# Patient Record
Sex: Female | Born: 1980
Health system: Southern US, Community
[De-identification: ages and names within clinical notes are randomized; demographics above are authoritative.]

## PROBLEM LIST (undated history)

## (undated) ENCOUNTER — Inpatient Hospital Stay (HOSPITAL_COMMUNITY): Payer: Self-pay

## (undated) DIAGNOSIS — M069 Rheumatoid arthritis, unspecified: Secondary | ICD-10-CM

## (undated) DIAGNOSIS — R519 Headache, unspecified: Secondary | ICD-10-CM

## (undated) DIAGNOSIS — F419 Anxiety disorder, unspecified: Secondary | ICD-10-CM

## (undated) DIAGNOSIS — H539 Unspecified visual disturbance: Secondary | ICD-10-CM

## (undated) DIAGNOSIS — Z87898 Personal history of other specified conditions: Secondary | ICD-10-CM

## (undated) DIAGNOSIS — R51 Headache: Secondary | ICD-10-CM

## (undated) HISTORY — DX: Unspecified visual disturbance: H53.9

## (undated) HISTORY — DX: Anxiety disorder, unspecified: F41.9

## (undated) HISTORY — DX: Personal history of other specified conditions: Z87.898

## (undated) HISTORY — PX: WISDOM TOOTH EXTRACTION: SHX21

## (undated) HISTORY — DX: Rheumatoid arthritis, unspecified: M06.9

---

## 2004-04-13 ENCOUNTER — Ambulatory Visit: Payer: Self-pay | Admitting: Internal Medicine

## 2004-04-27 ENCOUNTER — Ambulatory Visit: Payer: Self-pay | Admitting: Internal Medicine

## 2004-05-11 ENCOUNTER — Ambulatory Visit: Payer: Self-pay | Admitting: Internal Medicine

## 2004-10-09 ENCOUNTER — Emergency Department: Payer: Self-pay | Admitting: Emergency Medicine

## 2005-01-26 ENCOUNTER — Emergency Department: Payer: Self-pay | Admitting: Emergency Medicine

## 2005-01-26 ENCOUNTER — Ambulatory Visit: Payer: Self-pay

## 2005-01-30 ENCOUNTER — Ambulatory Visit: Payer: Self-pay | Admitting: Unknown Physician Specialty

## 2005-12-26 ENCOUNTER — Ambulatory Visit (HOSPITAL_COMMUNITY): Admission: RE | Admit: 2005-12-26 | Discharge: 2005-12-26 | Payer: Self-pay | Admitting: Obstetrics

## 2006-02-13 ENCOUNTER — Encounter: Admission: RE | Admit: 2006-02-13 | Discharge: 2006-03-21 | Payer: Self-pay | Admitting: Obstetrics

## 2006-04-07 ENCOUNTER — Inpatient Hospital Stay (HOSPITAL_COMMUNITY): Admission: AD | Admit: 2006-04-07 | Discharge: 2006-04-07 | Payer: Self-pay | Admitting: Obstetrics & Gynecology

## 2006-04-16 ENCOUNTER — Inpatient Hospital Stay (HOSPITAL_COMMUNITY): Admission: AD | Admit: 2006-04-16 | Discharge: 2006-04-21 | Payer: Self-pay | Admitting: Obstetrics

## 2006-04-19 ENCOUNTER — Encounter (INDEPENDENT_AMBULATORY_CARE_PROVIDER_SITE_OTHER): Payer: Self-pay | Admitting: Specialist

## 2006-12-24 ENCOUNTER — Ambulatory Visit: Payer: Self-pay | Admitting: Internal Medicine

## 2006-12-24 DIAGNOSIS — L299 Pruritus, unspecified: Secondary | ICD-10-CM | POA: Insufficient documentation

## 2006-12-24 DIAGNOSIS — F439 Reaction to severe stress, unspecified: Secondary | ICD-10-CM | POA: Insufficient documentation

## 2006-12-24 DIAGNOSIS — F411 Generalized anxiety disorder: Secondary | ICD-10-CM

## 2007-03-03 DIAGNOSIS — J019 Acute sinusitis, unspecified: Secondary | ICD-10-CM | POA: Insufficient documentation

## 2007-03-08 DIAGNOSIS — R059 Cough, unspecified: Secondary | ICD-10-CM | POA: Insufficient documentation

## 2007-03-08 DIAGNOSIS — R05 Cough: Secondary | ICD-10-CM | POA: Insufficient documentation

## 2007-03-13 ENCOUNTER — Ambulatory Visit: Payer: Self-pay | Admitting: Internal Medicine

## 2007-04-29 ENCOUNTER — Ambulatory Visit: Payer: Self-pay | Admitting: Internal Medicine

## 2007-04-29 DIAGNOSIS — L280 Lichen simplex chronicus: Secondary | ICD-10-CM | POA: Insufficient documentation

## 2007-08-13 ENCOUNTER — Ambulatory Visit (HOSPITAL_COMMUNITY): Admission: RE | Admit: 2007-08-13 | Discharge: 2007-08-13 | Payer: Self-pay | Admitting: Obstetrics

## 2007-12-11 ENCOUNTER — Inpatient Hospital Stay (HOSPITAL_COMMUNITY): Admission: AD | Admit: 2007-12-11 | Discharge: 2007-12-13 | Payer: Self-pay | Admitting: Obstetrics

## 2008-02-04 ENCOUNTER — Ambulatory Visit: Payer: Self-pay | Admitting: Internal Medicine

## 2008-02-04 DIAGNOSIS — R21 Rash and other nonspecific skin eruption: Secondary | ICD-10-CM | POA: Insufficient documentation

## 2008-02-05 LAB — CONVERTED CEMR LAB
ALT: 21 units/L (ref 0–35)
AST: 20 units/L (ref 0–37)
Albumin: 4 g/dL (ref 3.5–5.2)
Alkaline Phosphatase: 56 units/L (ref 39–117)
Anti Nuclear Antibody(ANA): NEGATIVE
BUN: 9 mg/dL (ref 6–23)
Basophils Absolute: 0 10*3/uL (ref 0.0–0.1)
Basophils Relative: 0.7 % (ref 0.0–3.0)
Bilirubin, Direct: 0.1 mg/dL (ref 0.0–0.3)
CO2: 27 meq/L (ref 19–32)
CRP, High Sensitivity: 2 (ref 0.00–5.00)
Calcium: 9 mg/dL (ref 8.4–10.5)
Chloride: 110 meq/L (ref 96–112)
Creatinine, Ser: 0.8 mg/dL (ref 0.4–1.2)
Creatinine,U: 189.2 mg/dL
Eosinophils Absolute: 0.2 10*3/uL (ref 0.0–0.7)
Eosinophils Relative: 4.7 % (ref 0.0–5.0)
GFR calc Af Amer: 112 mL/min
GFR calc non Af Amer: 93 mL/min
Glucose, Bld: 52 mg/dL — ABNORMAL LOW (ref 70–99)
HCT: 36.4 % (ref 36.0–46.0)
Hemoglobin: 12.9 g/dL (ref 12.0–15.0)
Lymphocytes Relative: 37.1 % (ref 12.0–46.0)
MCHC: 35.4 g/dL (ref 30.0–36.0)
MCV: 82.1 fL (ref 78.0–100.0)
Microalb Creat Ratio: 5.8 mg/g (ref 0.0–30.0)
Microalb, Ur: 1.1 mg/dL (ref 0.0–1.9)
Monocytes Absolute: 0.4 10*3/uL (ref 0.1–1.0)
Monocytes Relative: 9.3 % (ref 3.0–12.0)
Neutro Abs: 2 10*3/uL (ref 1.4–7.7)
Neutrophils Relative %: 48.2 % (ref 43.0–77.0)
Phosphorus: 4.1 mg/dL (ref 2.3–4.6)
Platelets: 293 10*3/uL (ref 150–400)
Potassium: 4.2 meq/L (ref 3.5–5.1)
RBC: 4.43 M/uL (ref 3.87–5.11)
RDW: 19.1 % — ABNORMAL HIGH (ref 11.5–14.6)
Rhuematoid fact SerPl-aCnc: <20 intl units/mL — ABNORMAL LOW (ref 0.0–20.0)
Sed Rate: 19 mm/hr (ref 0–22)
Sodium: 143 meq/L (ref 135–145)
TSH: 0.61 microintl units/mL (ref 0.35–5.50)
Total Bilirubin: 0.7 mg/dL (ref 0.3–1.2)
Total Protein: 7.3 g/dL (ref 6.0–8.3)
WBC: 4.1 10*3/uL — ABNORMAL LOW (ref 4.5–10.5)

## 2008-02-28 ENCOUNTER — Telehealth: Payer: Self-pay | Admitting: Internal Medicine

## 2008-04-08 ENCOUNTER — Ambulatory Visit: Payer: Self-pay | Admitting: Internal Medicine

## 2008-05-11 ENCOUNTER — Ambulatory Visit: Payer: Self-pay | Admitting: Internal Medicine

## 2008-07-21 ENCOUNTER — Ambulatory Visit: Payer: Self-pay | Admitting: Internal Medicine

## 2008-07-21 DIAGNOSIS — M2550 Pain in unspecified joint: Secondary | ICD-10-CM | POA: Insufficient documentation

## 2008-10-02 ENCOUNTER — Ambulatory Visit: Payer: Self-pay | Admitting: Internal Medicine

## 2008-10-02 LAB — CONVERTED CEMR LAB: Rapid Strep: NEGATIVE

## 2008-12-10 ENCOUNTER — Ambulatory Visit: Payer: Self-pay | Admitting: Internal Medicine

## 2008-12-10 DIAGNOSIS — L723 Sebaceous cyst: Secondary | ICD-10-CM | POA: Insufficient documentation

## 2009-01-20 ENCOUNTER — Ambulatory Visit: Payer: Self-pay | Admitting: Family Medicine

## 2009-03-01 ENCOUNTER — Ambulatory Visit: Payer: Self-pay | Admitting: Internal Medicine

## 2009-03-01 DIAGNOSIS — R634 Abnormal weight loss: Secondary | ICD-10-CM | POA: Insufficient documentation

## 2009-03-03 LAB — CONVERTED CEMR LAB
ALT: 30 units/L (ref 0–35)
AST: 22 units/L (ref 0–37)
Albumin: 4.2 g/dL (ref 3.5–5.2)
Alkaline Phosphatase: 84 units/L (ref 39–117)
BUN: 7 mg/dL (ref 6–23)
Basophils Absolute: 0.1 10*3/uL (ref 0.0–0.1)
Basophils Relative: 1.1 % (ref 0.0–3.0)
Bilirubin, Direct: 0 mg/dL (ref 0.0–0.3)
CO2: 28 meq/L (ref 19–32)
Calcium: 8.8 mg/dL (ref 8.4–10.5)
Chloride: 105 meq/L (ref 96–112)
Creatinine, Ser: 0.8 mg/dL (ref 0.4–1.2)
Eosinophils Absolute: 0.3 10*3/uL (ref 0.0–0.7)
Eosinophils Relative: 5 % (ref 0.0–5.0)
GFR calc non Af Amer: 91.51 mL/min (ref >60)
Glucose, Bld: 77 mg/dL (ref 70–99)
HCT: 37.2 % (ref 36.0–46.0)
Hemoglobin: 12.8 g/dL (ref 12.0–15.0)
Lymphocytes Relative: 27.3 % (ref 12.0–46.0)
Lymphs Abs: 1.7 10*3/uL (ref 0.7–4.0)
MCHC: 34.3 g/dL (ref 30.0–36.0)
MCV: 95.6 fL (ref 78.0–100.0)
Monocytes Absolute: 0.5 10*3/uL (ref 0.1–1.0)
Monocytes Relative: 7.9 % (ref 3.0–12.0)
Neutro Abs: 3.8 10*3/uL (ref 1.4–7.7)
Neutrophils Relative %: 58.7 % (ref 43.0–77.0)
Phosphorus: 4.3 mg/dL (ref 2.3–4.6)
Platelets: 174 10*3/uL (ref 150.0–400.0)
Potassium: 3.6 meq/L (ref 3.5–5.1)
RBC: 3.89 M/uL (ref 3.87–5.11)
RDW: 12.1 % (ref 11.5–14.6)
Sodium: 139 meq/L (ref 135–145)
TSH: 0.9 microintl units/mL (ref 0.35–5.50)
Total Bilirubin: 0.9 mg/dL (ref 0.3–1.2)
Total Protein: 6.7 g/dL (ref 6.0–8.3)
WBC: 6.4 10*3/uL (ref 4.5–10.5)

## 2009-11-04 ENCOUNTER — Ambulatory Visit: Payer: Self-pay | Admitting: Family Medicine

## 2009-11-04 DIAGNOSIS — M549 Dorsalgia, unspecified: Secondary | ICD-10-CM | POA: Insufficient documentation

## 2009-11-04 DIAGNOSIS — M26629 Arthralgia of temporomandibular joint, unspecified side: Secondary | ICD-10-CM | POA: Insufficient documentation

## 2009-11-04 DIAGNOSIS — M069 Rheumatoid arthritis, unspecified: Secondary | ICD-10-CM | POA: Insufficient documentation

## 2009-11-04 LAB — CONVERTED CEMR LAB
CRP, High Sensitivity: 0.56 (ref 0.00–5.00)
Sed Rate: 12 mm/hr (ref 0–22)

## 2009-11-05 LAB — CONVERTED CEMR LAB
Cyclic Citrullin Peptide Ab: <2 units (ref 0.0–5.0)
Rhuematoid fact SerPl-aCnc: <20 intl units/mL (ref 0–20)

## 2009-11-09 ENCOUNTER — Telehealth (INDEPENDENT_AMBULATORY_CARE_PROVIDER_SITE_OTHER): Payer: Self-pay | Admitting: *Deleted

## 2010-05-10 NOTE — Assessment & Plan Note (Signed)
Summary: JAW,BACK PAIN/CLE   Vital Signs:  Patient profile:   30 year old female Height:      65 inches Weight:      105.0 pounds BMI:     17.54 Temp:     98.7 degrees F oral Pulse rate:   76 / minute Pulse rhythm:   regular BP sitting:   100 / 70  (left arm) Cuff size:   regular  Vitals Entered By: Benny Lennert CMA Duncan Dull) (November 04, 2009 10:25 AM)  History of Present Illness: Chief complaint jaw and back pain  30 year old female:  Theumatoid arthritis. the patient reports being on methotrexate in the past, having seen Dr. Melbourne Abts are normal. I don't have old records from different consult most, but did review  some of her lab results,, and I told them do. The condition of the plausible disorder, within this I did have to defer to her polys in rheumatology.   Jaw pain some.   Was on MTX.  She also has 2 small children age 54 years and 3 years and is bending over picking them up a lot.  No bowel or bladder anesthesia, no weakness is noted she noted and no dad sensory spots as well.  REVIEW OF SYSTEMS  GEN: No systemic complaints, no fevers, chills, sweats, or other acute illnesses MSK: Detailed in the HPI GI: tolerating PO intake without difficulty Neuro: No numbness, parasthesias, or tingling associated. Otherwise the pertinent positives of the ROS are noted above.    Allergies: 1)  Citalopram Hydrobromide (Citalopram Hydrobromide)  Past History:  Past medical, surgical, family and social histories (including risk factors) reviewed, and no changes noted (except as noted below).  Past Medical History: Reviewed history from 12/24/2006 and no changes required. Anxiety  Family History: Reviewed history from 03/01/2009 and no changes required. Mom has lupus, hypothyroidism Brother has hyperthyroidism  Social History: Reviewed history from 12/10/2008 and no changes required. 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  Ears, externally no deformities Pulm: Breathing comfortably in no respiratory distress Range of motion at  the waist: on normal  No echymosis or edema Rises to examination table withNo difficulty Gait: minimally antalgic   Inspection/Deformity: N Paraspinus T: Y  B Ankle Dorsiflexion (L5,4): 5/5 B Great Toe Dorsiflexion (L5,4): 5/5 Heel Walk (L5): WNL Toe Walk (S1): WNL Rise/Squat (L4): WNL, mild pain, some discomfort  SENSORY B Medial Foot (L4): WNL B Dorsum (L5): WNL B Lateral (S1): WNL Light Touch: WNL Pinprick: WNL  REFLEXES Knee (L4): 2+ Ankle (S1): 2+  B SLR, seated: neg B SLR, supine: neg B FABER: neg B Reverse FABER: neg B Greater Troch: NT B Log Roll: neg B Stork: NT B Sciatic Notch: NT Leg Lengths: equal  John no focal skin tenderness or redness around it, it is tender but opening    Impression & Recommendations:  Problem # 1:  RHEUMATOID ARTHRITIS (ICD-714.0) Patient says she was on methotrexate, have to soon as she does have indeed a rheumatological disorder.  And then they'll check a sedimentation rate CRP to see if this will be most consistent with inflammatory arthritis, liver units presentation I think that'll be less likely.  ggoing to see Dr. Garald Balding will see her again, given the bases of DMARD medications,  patient is interested in  some therapy. She is not wanting a pregnancy at all right now and currently has an IUD the  Her updated medication list for this problem includes:    Prednisone 20 Mg Tabs (Prednisone) .Marland Kitchen... 2 tabs by mouth daily for a week, then 1 tab for  4 days  Problem # 2:  BACK PAIN (ICD-724.5) almost a scale of cold Her updated medication list for this problem  includes:    Tizanidine Hcl 4 Mg Tabs (Tizanidine hcl) .Marland Kitchen... 1 by mouth at bedtime  Orders: Rheumatology Referral (Rheumatology) Venipuncture (60454) TLB-CRP-High Sensitivity (C-Reactive Protein) (86140-FCRP) TLB-Sedimentation Rate (ESR) (85652-ESR) T- * Misc. Laboratory test [phone]) Specimen Handling (91478) T-Rheumatoid Factor [phone])  Problem # 3:  TMJ PAIN (ICD-524.62) the jaw pain some pseudoptosis in the mid good candidate pain  Complete Medication List: 1)  Lorazepam 0.5 Mg Tabs (Lorazepam) .... 1/2 - 1 tab two times a day as needed for nerves 2)  Prednisone 20 Mg Tabs (Prednisone) .... 2 tabs by mouth daily for a week, then 1 tab for  4 days 3)  Tizanidine Hcl 4 Mg Tabs (Tizanidine hcl) .Marland Kitchen.. 1 by mouth at bedtime  Patient Instructions: 1)  Referral Appointment Information 2)  Day/Date: 3)  Time: 4)  Place/MD: 5)  Address: 6)  Phone/Fax: 7)  Patient given appointment information. Information/Orders faxed/mailed.  Prescriptions: TIZANIDINE HCL 4 MG TABS (TIZANIDINE HCL) 1 by mouth at bedtime  #30 x 0   Entered and Authorized by:   Hannah Beat MD   Signed by:   Hannah Beat MD on 11/04/2009   Method used:   Electronically to        [facility]  [address] [id number]* (retail)       [address]       [place], [place]  [address]       Ph: [phone] or [phone]       Fax: [phone]   RxID:   [id number] PREDNISONE 20 MG TABS (PREDNISONE) 2 tabs by mouth daily for a week, then 1 tab for  4 days  #18 x 0   Entered and Authorized by:   Hannah Beat MD   Signed by:   Hannah Beat MD on 11/04/2009   Method used:   Electronically to        [facility]  [address] [id number]* (retail)       [address]       [place], [place]  [address]       Ph: [phone] or [phone]       Fax: [phone]   RxID:   [id number]   Current Allergies (reviewed today): CITALOPRAM HYDROBROMIDE (CITALOPRAM  HYDROBROMIDE)

## 2010-05-10 NOTE — Progress Notes (Signed)
Summary: To Keep Rheumatology appt per Dr. Patsy Lager  ---- Converted from flag ----  Sp w/ Pt, she will keep the Rheumatology appt.Daine Gip    ---- 11/09/2009 6:28 AM, Hannah Beat MD wrote: No -- I meant that the patient currently has a normal sed rate and CRP. Unlikely that current jaw / back pain coming from rheumatological disease.    She tells me that she has RA and was on methotrexate in the past. Discussion about newer disease modifying agents is most appropriate with her rheumatologist for long term. I do not have all notes, but am pretty convinced that Dr. Garald Balding. would not have put her on that medication without some type of Rheumatological disorder.  Please make this a part of the note.   ---- 11/08/2009 2:48 PM, Daine Gip wrote: Dr. Patsy Lager, Pt says she was told by the nurse she did not have to go to the Rheumatology referral, based on the recent labs,  Please advise. I have an appt scheduled for August 10th w/ Dr. Lavenia Atlas...   Aram Beecham ------------------------------

## 2010-09-07 ENCOUNTER — Encounter: Payer: Self-pay | Admitting: Internal Medicine

## 2010-09-08 ENCOUNTER — Ambulatory Visit (INDEPENDENT_AMBULATORY_CARE_PROVIDER_SITE_OTHER): Payer: Managed Care, Other (non HMO) | Admitting: Internal Medicine

## 2010-09-08 ENCOUNTER — Encounter: Payer: Self-pay | Admitting: Internal Medicine

## 2010-09-08 VITALS — BP 102/60 | HR 90 | Temp 98.8°F | Ht 65.0 in | Wt 102.0 lb

## 2010-09-08 DIAGNOSIS — F411 Generalized anxiety disorder: Secondary | ICD-10-CM

## 2010-09-08 DIAGNOSIS — M069 Rheumatoid arthritis, unspecified: Secondary | ICD-10-CM

## 2010-09-08 DIAGNOSIS — M549 Dorsalgia, unspecified: Secondary | ICD-10-CM

## 2010-09-08 DIAGNOSIS — M26629 Arthralgia of temporomandibular joint, unspecified side: Secondary | ICD-10-CM

## 2010-09-08 MED ORDER — CYCLOBENZAPRINE HCL 10 MG PO TABS: 5.0000 mg | ORAL_TABLET | Freq: Every evening | ORAL | Status: DC | PRN

## 2010-09-08 MED ORDER — VENLAFAXINE HCL ER 75 MG PO CP24: 75.0000 mg | ORAL_CAPSULE | Freq: Every day | ORAL | Status: DC

## 2010-09-08 NOTE — Patient Instructions (Signed)
Please try a mouth guard at night to see if that helps your jaw Call if you have persistent problems with the new medication

## 2010-09-08 NOTE — Assessment & Plan Note (Signed)
Exacerbated again No sig depression Failed 3 SSRIs due to side effects Will try venlafaxine---consider buspirone but she is not sure about compliance, esp with bid

## 2010-09-08 NOTE — Assessment & Plan Note (Signed)
This is clearly in question No evidence of active disease currently

## 2010-09-08 NOTE — Assessment & Plan Note (Signed)
Ongoing issues Still seems to be just TMJ Discussed trying a mouth guard at night

## 2010-09-08 NOTE — Assessment & Plan Note (Signed)
Not clearly nerve related despite the burning Will try muscle relaxer for bedtime

## 2010-09-08 NOTE — Progress Notes (Signed)
  Subjective:    Patient ID: Debra Gibbs, female    DOB: September 19, 1980, 30 y.o.   MRN: [id number]  HPI Micah Flesher to rheumatologist some years ago Had knee drained and cortisone shot in leg Told she has RA but hasn't been on meds  Still having jaw pain on the right Has kept up with dental appts--no reported teeth problems Wisdom teeth removed also Points to top of right mandible No known teeth grinding  Also has burning pain along right posterior shoulder Trying to be better with her posture Awakens her several times per night for the past few months  Anxiety has been an ongoing issue Lots going on---rarely uses lorazepam (no refill in 18 months) Now back to using more regularly Lots of deaths and other "stuff" Concern that her daughter may have Asperger's Now back to being scared of "everything" Works herself up worried that more people will die (GF and cousin lately) Mind still goes "a mile a minute" despite the lorazepam Has more bad days than good lately Doesn't feel depressed Not anhedonic---loves family and kids  Past use of prozac (bad mood swings) and paxil (increased panic symptoms) and celexa (made her jittery)  Current outpatient prescriptions:LORazepam (ATIVAN) 0.5 MG tablet, Take 1/2-1 tablet by mouth two times a day as needed for nerves , Disp: , Rfl: ;  DISCONTD: tiZANidine (ZANAFLEX) 4 MG capsule, Take 4 mg by mouth at bedtime.  , Disp: , Rfl:   Past Medical History  Diagnosis Date  . Anxiety     No past surgical history on file.  No family history on file.  History   Social History  . Marital Status: Married    Spouse Name: N/A    Number of Children: 2  . Years of Education: N/A   Occupational History  . waitressing part-time, [employer] in [place]    Social History Main Topics  . Smoking status: Former Smoker    Quit date: 04/10/2005  . Smokeless tobacco: Never Used  . Alcohol Use: No  . Drug Use: Not on file  . Sexually Active: Not on file    Other Topics Concern  . Not on file   Social History Narrative  . No narrative on file   Review of Systems Appetite is never great Weight is stable    Objective:   Physical Exam  Constitutional: She appears well-developed and well-nourished. No distress.  HENT:       Localized tenderness right at right TMJ No clicking No oral lesions  Neck: Normal range of motion. Neck supple.  Musculoskeletal:       No synovitis at all No restriction in ROM at right shoulder or sig spasm  Lymphadenopathy:    She has no cervical adenopathy.  Psychiatric: Her behavior is normal. Judgment and thought content normal.       Mildly anxious  Some tears when relating all stressors          Assessment & Plan:

## 2010-09-13 ENCOUNTER — Telehealth: Payer: Self-pay | Admitting: *Deleted

## 2010-09-13 NOTE — Telephone Encounter (Signed)
Patient says that the flexeril is not helping at all. She is asking if there is something else she could try. Uses [facility] [address]

## 2010-09-14 MED ORDER — CARISOPRODOL 350 MG PO TABS: ORAL_TABLET | ORAL | Status: DC

## 2010-09-14 NOTE — Telephone Encounter (Signed)
rx sent to pharmacy, Spoke with patient and advised results

## 2010-09-14 NOTE — Telephone Encounter (Signed)
Okay to send Rx for carisprodol 350mg  #30 x 0 1/2-1 at bedtime prn for pain or muscle spasm

## 2010-09-29 ENCOUNTER — Encounter: Payer: Self-pay | Admitting: Internal Medicine

## 2010-09-29 ENCOUNTER — Ambulatory Visit (INDEPENDENT_AMBULATORY_CARE_PROVIDER_SITE_OTHER): Payer: Managed Care, Other (non HMO) | Admitting: Internal Medicine

## 2010-09-29 VITALS — BP 100/60 | HR 79 | Temp 98.4°F | Ht 65.0 in | Wt 102.0 lb

## 2010-09-29 DIAGNOSIS — F411 Generalized anxiety disorder: Secondary | ICD-10-CM

## 2010-09-29 MED ORDER — LORAZEPAM 0.5 MG PO TABS: 0.2500 mg | ORAL_TABLET | Freq: Three times a day (TID) | ORAL | Status: DC | PRN

## 2010-09-29 NOTE — Progress Notes (Signed)
  Subjective:    Patient ID: Debra Gibbs, female    DOB: 1981/03/02, 30 y.o.   MRN: [id number]  HPI Tried the venlafaxine for a few days Noted that she was a little shaky Stopped it due to this  Now feels like her anxiety is a little better Feels like she can do without the everyday med  Since last visit Has had a couple of spells of anxiety Has used the lorazepam with some success  Not depressed  Current Outpatient Prescriptions on File Prior to Visit  Medication Sig Dispense Refill  . carisoprodol (SOMA) 350 MG tablet Take 1/2-1 tablet by mouth once daily at bedtime as needed  30 tablet  0  . venlafaxine (EFFEXOR-XR) 75 MG 24 hr capsule Take 1 capsule (75 mg total) by mouth daily.  30 capsule  11  . DISCONTD: LORazepam (ATIVAN) 0.5 MG tablet Take 1/2-1 tablet by mouth two times a day as needed for nerves        Past Medical History  Diagnosis Date  . Anxiety     No past surgical history on file.  No family history on file.  History   Social History  . Marital Status: Married    Spouse Name: N/A    Number of Children: 2  . Years of Education: N/A   Occupational History  . waitressing part-time, [employer] in [place]    Social History Main Topics  . Smoking status: Former Smoker    Quit date: 04/10/2005  . Smokeless tobacco: Never Used  . Alcohol Use: No  . Drug Use: Not on file  . Sexually Active: Not on file   Other Topics Concern  . Not on file   Social History Narrative  . No narrative on file   Review of Systems Sleeps okay  Has used the flexeril for her neck/shoulder pain. Did have some hung over effect Now trying the carisprodol--not having side effects     Objective:   Physical Exam  Constitutional: She appears well-developed and well-nourished. No distress.  Psychiatric: She has a normal mood and affect. Her behavior is normal. Judgment and thought content normal.          Assessment & Plan:

## 2010-09-29 NOTE — Assessment & Plan Note (Signed)
Mild shakiness with venlafaxine but probably could tolerate Anxiety is better Will go back to just prn lorazepam

## 2010-10-05 ENCOUNTER — Encounter: Payer: Self-pay | Admitting: Family Medicine

## 2010-10-05 ENCOUNTER — Ambulatory Visit (INDEPENDENT_AMBULATORY_CARE_PROVIDER_SITE_OTHER): Payer: Managed Care, Other (non HMO) | Admitting: Family Medicine

## 2010-10-05 DIAGNOSIS — M7550 Bursitis of unspecified shoulder: Secondary | ICD-10-CM

## 2010-10-05 DIAGNOSIS — M67919 Unspecified disorder of synovium and tendon, unspecified shoulder: Secondary | ICD-10-CM

## 2010-10-05 DIAGNOSIS — M67929 Unspecified disorder of synovium and tendon, unspecified upper arm: Secondary | ICD-10-CM

## 2010-10-05 DIAGNOSIS — M7520 Bicipital tendinitis, unspecified shoulder: Secondary | ICD-10-CM

## 2010-10-05 MED ORDER — HYDROCODONE-ACETAMINOPHEN 5-500 MG PO TABS: 1.0000 | ORAL_TABLET | Freq: Four times a day (QID) | ORAL | Status: AC | PRN

## 2010-10-05 NOTE — Patient Instructions (Signed)
F/u 1 month 

## 2010-10-08 DIAGNOSIS — M7550 Bursitis of unspecified shoulder: Secondary | ICD-10-CM | POA: Insufficient documentation

## 2010-10-08 DIAGNOSIS — M67929 Unspecified disorder of synovium and tendon, unspecified upper arm: Secondary | ICD-10-CM | POA: Insufficient documentation

## 2010-10-08 NOTE — Progress Notes (Signed)
The patient noted above presents with shoulder pain that has been ongoing for several days there is no history of trauma or accident recently The patient does have some neck and trap strain and tension Denies dislocation, subluxation, separation of the shoulder. The patient does complain of pain in the overhead plane with significant painful arc of motion, mild with more pain with IROM  Medications Tried: Tylenol, NSAIDS, muscle relaxant, tramadol Ice or Heat: minimally helpful Tried PT: No  Prior shoulder Injury: No Prior surgery: No Prior fracture: No  The PMH, PSH, Social History, Family History, Medications, and allergies have been reviewed in [facility], and have been updated if relevant.  REVIEW OF SYSTEMS  GEN: No fevers, chills. Nontoxic. Primarily MSK c/o today. MSK: Detailed in the HPI GI: tolerating PO intake without difficulty Neuro: No numbness, parasthesias, or tingling associated. Otherwise the pertinent positives of the ROS are noted above.   PHYSICAL EXAM  Blood pressure 100/60, pulse 87, temperature 98.4 F (36.9 C), temperature source Oral, height 5\' 2"  (1.575 m), weight 103 lb (46.72 kg), SpO2 100.00%.  GEN: Well-developed,well-nourished,in no acute distress; alert,appropriate and cooperative throughout examination HEENT: Normocephalic and atraumatic without obvious abnormalities. Ears, externally no deformities PULM: Breathing comfortably in no respiratory distress EXT: No clubbing, cyanosis, or edema PSYCH: Normally interactive. Cooperative during the interview. Pleasant. Friendly and conversant. Not anxious or depressed appearing. Normal, full affect.  Shoulder: R Inspection: No muscle wasting, mild winging Ecchymosis/edema: neg  AC joint, scapula, clavicle: NT Cervical spine: NT, full ROM Spurling's: neg Abduction: full, 5/5 Flexion: full, 5/5 IR, full, lift-off: 5/5 ER at neutral: full, 5/5 AC crossover: neg Neer: minimally positive Hawkins: minimally  positive Drop Test: neg Empty Can: neg Supraspinatus insertion: mild-mod T Bicipital groove: TTP Speed's: TTP Yergason's: TTP Sulcus sign: neg Scapular dyskinesis: none C5-T1 intact  Neuro: Sensation intact Grip 5/5   Assessment and Plan: 1.  Biceps tendinopathy, RIGHT: tender at the tendon sheath and speeds and Yergason's are positive. Anterior shoulder pain. Continue his anti-inflammatories, she is having with subside severe pain so I gave her a low number of some Vicodin, which I think is probably reasonable over the next week or so, but I do not expect her to need any additional narcotics. Some tramadol is reasonable. Recommended rotator cuff strengthening and scapular stabilization, as well as some eccentric biceps lowers. Reviewed Harvard protocol.  2. Subcoracoid bursitis. Clinically on exam tender more in the subcoracoid region and with maneuvers to make this impinge.  F/u 1 month

## 2010-11-09 ENCOUNTER — Ambulatory Visit: Payer: Managed Care, Other (non HMO) | Admitting: Family Medicine

## 2010-11-09 DIAGNOSIS — Z0289 Encounter for other administrative examinations: Secondary | ICD-10-CM

## 2010-12-19 ENCOUNTER — Other Ambulatory Visit: Payer: Self-pay | Admitting: Internal Medicine

## 2010-12-19 NOTE — Telephone Encounter (Signed)
Okay #30 x 0 

## 2010-12-19 NOTE — Telephone Encounter (Signed)
rx called into pharmacy

## 2011-01-11 LAB — CBC
HCT: 27.3 — ABNORMAL LOW
HCT: 29.7 — ABNORMAL LOW
Hemoglobin: 9.3 — ABNORMAL LOW
Hemoglobin: 9.7 — ABNORMAL LOW
MCHC: 32.7
MCHC: 34
MCV: 84.4
MCV: 88.6
Platelets: 195
Platelets: 223
RBC: 3.08 — ABNORMAL LOW
RBC: 3.52 — ABNORMAL LOW
RDW: 14.6
RDW: 15
WBC: 10.3
WBC: 8.3

## 2011-01-11 LAB — RPR: RPR Ser Ql: NONREACTIVE

## 2011-02-13 ENCOUNTER — Encounter: Payer: Self-pay | Admitting: Family Medicine

## 2011-02-13 ENCOUNTER — Ambulatory Visit (INDEPENDENT_AMBULATORY_CARE_PROVIDER_SITE_OTHER): Payer: Managed Care, Other (non HMO) | Admitting: Family Medicine

## 2011-02-13 VITALS — BP 102/70 | HR 90 | Temp 98.2°F | Ht 62.0 in | Wt 103.0 lb

## 2011-02-13 DIAGNOSIS — J069 Acute upper respiratory infection, unspecified: Secondary | ICD-10-CM

## 2011-02-13 MED ORDER — AMOXICILLIN-POT CLAVULANATE 875-125 MG PO TABS: 1.0000 | ORAL_TABLET | Freq: Two times a day (BID) | ORAL | Status: AC

## 2011-02-13 MED ORDER — CHLORPHENIRAMINE-HYDROCODONE 8-10 MG/5ML PO LQCR: 5.0000 mL | Freq: Two times a day (BID) | ORAL | Status: DC | PRN

## 2011-02-13 NOTE — Patient Instructions (Signed)
Take antibiotic as directed if symptoms do not improve.  Drink lots of fluids.  Treat sympotmatically with Mucinex, nasal saline irrigation, and Tylenol/Ibuprofen. Also try claritin D or zyrtec D over the counter- two times a day as needed ( have to sign for them at pharmacy). You can use warm compresses.  Cough suppressant at night. Call if not improving as expected in 5-7 days.

## 2011-02-13 NOTE — Progress Notes (Signed)
SUBJECTIVE:  Debra Gibbs is a 30 y.o. female who complains of coryza, congestion and sneezing for 3days. She denies a history of anorexia, chest pain, dizziness, nausea and shortness of breath and denies a history of asthma. Patient denies smoke cigarettes.   Patient Active Problem List  Diagnoses  . ANXIETY  . TMJ PAIN  . NEURODERMATITIS  . RHEUMATOID ARTHRITIS  . BACK PAIN  . MALAR RASH  . WEIGHT LOSS, ABNORMAL  . Back pain  . Shoulder bursitis  . Biceps tendinopathy   Past Medical History  Diagnosis Date  . Anxiety    No past surgical history on file. History  Substance Use Topics  . Smoking status: Former Smoker    Quit date: 04/10/2005  . Smokeless tobacco: Never Used  . Alcohol Use: No   No family history on file. Allergies  Allergen Reactions  . Citalopram Hydrobromide     REACTION: shakes and made her jaw tight   Current Outpatient Prescriptions on File Prior to Visit  Medication Sig Dispense Refill  . LORazepam (ATIVAN) 0.5 MG tablet take 1/2 to 1 tablet by mouth every 8 hours if needed  30 tablet  0    OBJECTIVE: BP 102/70  Pulse 90  Temp(Src) 98.2 F (36.8 C) (Oral)  Ht 5\' 2"  (1.575 m)  Wt 103 lb (46.72 kg)  BMI 18.84 kg/m2  She appears well, vital signs are as noted. Ears normal.  Throat and pharynx normal.  Neck supple. No adenopathy in the neck. Nose is congested. Sinuses non tender. The chest is clear, without wheezes or rales.  ASSESSMENT:  viral upper respiratory illness  PLAN: Symptomatic therapy suggested: push fluids, rest and return office visit prn if symptoms persist or worsen. Lack of antibiotic effectiveness discussed with her. Call or return to clinic prn if these symptoms worsen or fail to improve as anticipated.

## 2011-02-27 ENCOUNTER — Telehealth: Payer: Self-pay | Admitting: *Deleted

## 2011-02-27 NOTE — Telephone Encounter (Signed)
Received fax refill request, in your IN box.

## 2011-02-27 NOTE — Telephone Encounter (Signed)
Okay to refill the cough syrup 60cc x 0 Make sure she is feeling better in general

## 2011-02-28 ENCOUNTER — Other Ambulatory Visit: Payer: Self-pay | Admitting: *Deleted

## 2011-02-28 MED ORDER — LORAZEPAM 0.5 MG PO TABS: 0.5000 mg | ORAL_TABLET | Freq: Three times a day (TID) | ORAL | Status: DC | PRN

## 2011-02-28 MED ORDER — HYDROCOD POLST-CHLORPHEN POLST 10-8 MG/5ML PO LQCR: 5.0000 mL | Freq: Two times a day (BID) | ORAL | Status: DC | PRN

## 2011-02-28 NOTE — Telephone Encounter (Signed)
Patient states she have death in the family and would like a refill of lorazepam, please advise

## 2011-02-28 NOTE — Telephone Encounter (Signed)
rx called into pharmacy Left message on machine that rx was called in

## 2011-02-28 NOTE — Telephone Encounter (Signed)
Okay to refill #30 x 0 

## 2011-02-28 NOTE — Telephone Encounter (Signed)
rx called into pharmacy

## 2011-03-01 ENCOUNTER — Telehealth: Payer: Self-pay | Admitting: Internal Medicine

## 2011-03-01 NOTE — Telephone Encounter (Signed)
Medication called in to [facility], spoke with [facility] and canceled that rx

## 2011-03-01 NOTE — Telephone Encounter (Signed)
Third request from pharmacy for Tussinex 60ml for patient.  Refill needs to be called in and not faxed back.  Call pharm at [phone]

## 2011-05-11 ENCOUNTER — Ambulatory Visit (INDEPENDENT_AMBULATORY_CARE_PROVIDER_SITE_OTHER)
Admission: RE | Admit: 2011-05-11 | Discharge: 2011-05-11 | Disposition: A | Discharge status: Home / Self Care | Payer: Managed Care, Other (non HMO) | Source: Ambulatory Visit | Attending: Internal Medicine | Admitting: Internal Medicine

## 2011-05-11 ENCOUNTER — Encounter: Payer: Self-pay | Admitting: Internal Medicine

## 2011-05-11 ENCOUNTER — Ambulatory Visit (INDEPENDENT_AMBULATORY_CARE_PROVIDER_SITE_OTHER): Payer: Managed Care, Other (non HMO) | Admitting: Internal Medicine

## 2011-05-11 VITALS — BP 111/73 | HR 98 | Temp 98.8°F | Wt 106.0 lb

## 2011-05-11 DIAGNOSIS — M549 Dorsalgia, unspecified: Secondary | ICD-10-CM

## 2011-05-11 NOTE — Progress Notes (Signed)
  Subjective:    Patient ID: Debra Gibbs, female    DOB: May 09, 1980, 31 y.o.   MRN: [id number]  HPI Having right upper lumbar pain Gets sharp pain at side of spine that shoots up if she moves in a certain direction--esp if she arches her back Chronic backaches---esp burning in upper back This is different--really feels it is in her spine  The shooting pain if very brief but then has isolated bony type pain in that one spot Then this pain may stay all day Has also occurred with twisting  Pain doesn't radiate to legs at all No leg weakness  First noticed about 2.5 weeks ago No known injury  Has been using ibuprofen and tramadol  Current Outpatient Prescriptions on File Prior to Visit  Medication Sig Dispense Refill  . LORazepam (ATIVAN) 0.5 MG tablet Take 1-2 tablets (0.5-1 mg total) by mouth every 8 (eight) hours as needed for anxiety.  30 tablet  0    Allergies  Allergen Reactions  . Citalopram Hydrobromide     REACTION: shakes and made her jaw tight    Past Medical History  Diagnosis Date  . Anxiety     No past surgical history on file.  No family history on file.  History   Social History  . Marital Status: Married    Spouse Name: N/A    Number of Children: 2  . Years of Education: N/A   Occupational History  . waitressing part-time, [employer] in [place]    Social History Main Topics  . Smoking status: Former Smoker    Quit date: 04/10/2005  . Smokeless tobacco: Never Used  . Alcohol Use: No  . Drug Use: Not on file  . Sexually Active: Not on file   Other Topics Concern  . Not on file   Social History Narrative  . No narrative on file    Review of Systems No rash  No fever Some swelling in toes---relates to her arthritis (sporadic) Intermittent jaw pain--mouth guard at night didn't help Appetite okay Weight stable    Objective:   Physical Exam  Constitutional: She appears well-developed and well-nourished. No distress.    Musculoskeletal:       Localized bony tenderness over T12 or L1 No paraspinal spasm or tenderness Back flexion limited by pain in spine area  Neurological:       Normal gait and strength          Assessment & Plan:

## 2011-05-11 NOTE — Assessment & Plan Note (Signed)
Has specific bony tenderness No muscle findings I don't see anything on x-ray--will await the radiologist report  Not sure more meds appropriate ?chiropractor ?orthopedist

## 2011-05-16 ENCOUNTER — Telehealth: Payer: Self-pay | Admitting: Internal Medicine

## 2011-05-16 NOTE — Telephone Encounter (Signed)
Called yesterday about x-ray results and received message that it came back fine but is still having pain in back and seeking advice; impacting her sleep, doctor said might be able to try other things.  Has tried heating pad and ibuprofen with no relief.  Please call patient back.

## 2011-05-17 ENCOUNTER — Telehealth: Payer: Self-pay | Admitting: Internal Medicine

## 2011-05-17 NOTE — Telephone Encounter (Signed)
Triage Record Num: [id number] Operator: Di Kindle Patient Name: Debra Gibbs Call Date & Time: 05/17/2011 2:54:57PM Patient Phone: [phone] PCP: Tillman Abide Patient Gender: Female PCP Fax : [phone] Patient DOB: 1981-01-28 Practice Name: [facility] Reason for Call: OFFICE Please: Caller: Debra Gibbs/Patient; PCP: Tillman Abide I.; CB#: [phone]; Call regarding Back Pain; onset ~ 3 weeks, last OV 05/11/11, was told xrays were normal, this pain continues with movement, sharp pain close to middle of back, using Tramadol, which relieves the pain only a small amt, sleeps less. Please call pt with recommendations. OFFICE Please Protocol(s) Used: PCP Calls, No Triage (Adult) Recommended Outcome per Protocol: Call Provider within 24 Hours Reason for Outcome: [1] Caller requests to speak ONLY to PCP AND [2] nonurgent question Care Advice: ~ 05/17/2011 3:03:25PM Page 1 of 1 [email or web address]

## 2011-05-17 NOTE — Telephone Encounter (Signed)
See other note with recommendations

## 2011-05-17 NOTE — Telephone Encounter (Signed)
With nerve type pains like this, occ prednisone is tried to reduce inflammation.  Please try prednisone 20mg   2 tabs daily for 5 days then 1 tab daily for 5 days #15 x 0 If ineffective, will try a medicine that works as a nerve pain reliever for nighttime (would try this if no better by Monday)

## 2011-05-18 ENCOUNTER — Telehealth: Payer: Self-pay | Admitting: Internal Medicine

## 2011-05-18 MED ORDER — HYDROCODONE-ACETAMINOPHEN 5-325 MG PO TABS: 1.0000 | ORAL_TABLET | Freq: Every evening | ORAL | Status: DC | PRN

## 2011-05-18 MED ORDER — PREDNISONE 20 MG PO TABS: ORAL_TABLET | ORAL | Status: DC

## 2011-05-18 NOTE — Telephone Encounter (Signed)
Okay to phone in norco 5/325 1-2 at bedtime prn for severe pain  #40 x 0

## 2011-05-18 NOTE — Telephone Encounter (Signed)
Patient is requesting pain medication until Prednisone is helping.  She said that she is uncomfortable at night and is using ibuprofen as well as heating pad with no relief.  She is requesting a mild pain pill for 1-2 days until Prednisone starts to help.  Call back is [phone].

## 2011-05-18 NOTE — Telephone Encounter (Signed)
rx called into pharmacy Spoke with patient and advised results   

## 2011-05-18 NOTE — Telephone Encounter (Signed)
Spoke with patient and advised results rx sent to pharmacy by e-script  

## 2011-06-20 ENCOUNTER — Other Ambulatory Visit: Payer: Self-pay | Admitting: *Deleted

## 2011-06-20 MED ORDER — LORAZEPAM 0.5 MG PO TABS: 0.5000 mg | ORAL_TABLET | Freq: Three times a day (TID) | ORAL | Status: DC | PRN

## 2011-06-20 NOTE — Telephone Encounter (Signed)
Px written for call in   

## 2011-06-20 NOTE — Telephone Encounter (Signed)
Phoned refill request from patient.  She states she hasnt had this filled since November.

## 2011-06-21 NOTE — Telephone Encounter (Signed)
Left vm for pt to callback 

## 2011-06-22 NOTE — Telephone Encounter (Signed)
Left v/m message on pts cell phone med had been called in to [facility] aid any questions pt could call back to our office.Medication phoned to [facility] aid [phone]. pharmacy as instructed.

## 2011-06-30 ENCOUNTER — Ambulatory Visit (INDEPENDENT_AMBULATORY_CARE_PROVIDER_SITE_OTHER): Payer: Managed Care, Other (non HMO) | Admitting: Internal Medicine

## 2011-06-30 ENCOUNTER — Encounter: Payer: Self-pay | Admitting: Internal Medicine

## 2011-06-30 VITALS — BP 100/60 | HR 81 | Temp 98.0°F | Wt 103.0 lb

## 2011-06-30 DIAGNOSIS — J019 Acute sinusitis, unspecified: Secondary | ICD-10-CM | POA: Insufficient documentation

## 2011-06-30 MED ORDER — AMOXICILLIN 500 MG PO TABS: 1000.0000 mg | ORAL_TABLET | Freq: Two times a day (BID) | ORAL | Status: AC

## 2011-06-30 NOTE — Progress Notes (Signed)
  Subjective:    Patient ID: Debra Gibbs, female    DOB: 03-Jan-1981, 31 y.o.   MRN: [id number]  HPI Back was really improved by the prednisone Also decreased swelling in toes  Feels her sinuses are infected Started 8-9 days ago Lots of cough  Sore throat and chest congestion Then went up into her head---now has pressure behind her eye---left side Can't breathe or smell out of left side Chills, lethargy, some SOB (due to nasal congestion) esp in past 1-2 days Sweat last night in bed Achy today  Has been using zyrtec for 2 weeks---helped at first but not this week Tried benedryl last night--dried her out some Did try some advil for headache earlier in the week  Current Outpatient Prescriptions on File Prior to Visit  Medication Sig Dispense Refill  . LORazepam (ATIVAN) 0.5 MG tablet Take 1-2 tablets (0.5-1 mg total) by mouth every 8 (eight) hours as needed for anxiety.  30 tablet  0    Allergies  Allergen Reactions  . Citalopram Hydrobromide     REACTION: shakes and made her jaw tight    Past Medical History  Diagnosis Date  . Anxiety     No past surgical history on file.  No family history on file.  History   Social History  . Marital Status: Married    Spouse Name: N/A    Number of Children: 2  . Years of Education: N/A   Occupational History  . waitressing part-time, [employer] in [place]    Social History Main Topics  . Smoking status: Former Smoker    Quit date: 04/10/2005  . Smokeless tobacco: Never Used  . Alcohol Use: No  . Drug Use: Not on file  . Sexually Active: Not on file   Other Topics Concern  . Not on file   Social History Narrative  . No narrative on file   Review of Systems No rash No vomiting or diarrhea No problems with appetite     Objective:   Physical Exam  Constitutional: She appears well-developed and well-nourished. No distress.  HENT:  Mouth/Throat: Oropharynx is clear and moist. No oropharyngeal exudate.       No  sinus tenderness TMs normal Moderate nasal inflammation  Neck: Normal range of motion. Neck supple.  Pulmonary/Chest: Effort normal and breath sounds normal. No respiratory distress. She has no wheezes. She has no rales.  Lymphadenopathy:    She has no cervical adenopathy.          Assessment & Plan:

## 2011-06-30 NOTE — Assessment & Plan Note (Signed)
Has concommitant allergies Seems like she has worsening infection that started a week ago Discussed supportive Rx Will give amoxil

## 2011-09-08 ENCOUNTER — Encounter (HOSPITAL_COMMUNITY): Payer: Self-pay | Admitting: *Deleted

## 2011-09-08 ENCOUNTER — Emergency Department (HOSPITAL_COMMUNITY)
Admission: EM | Admit: 2011-09-08 | Discharge: 2011-09-08 | Disposition: A | Discharge status: Home / Self Care | Payer: Managed Care, Other (non HMO) | Source: Home / Self Care | Attending: Emergency Medicine | Admitting: Emergency Medicine

## 2011-09-08 DIAGNOSIS — S61011A Laceration without foreign body of right thumb without damage to nail, initial encounter: Secondary | ICD-10-CM

## 2011-09-08 DIAGNOSIS — S61209A Unspecified open wound of unspecified finger without damage to nail, initial encounter: Secondary | ICD-10-CM

## 2011-09-08 MED ORDER — TETANUS-DIPHTH-ACELL PERTUSSIS 5-2.5-18.5 LF-MCG/0.5 IM SUSP
0.5000 mL | Freq: Once | INTRAMUSCULAR | Status: AC
  Administered 2011-09-08: 0.5 mL via INTRAMUSCULAR

## 2011-09-08 MED ORDER — IBUPROFEN 600 MG PO TABS: 600.0000 mg | ORAL_TABLET | Freq: Four times a day (QID) | ORAL | Status: AC | PRN

## 2011-09-08 MED ORDER — HYDROCODONE-ACETAMINOPHEN 5-325 MG PO TABS: 2.0000 | ORAL_TABLET | ORAL | Status: AC | PRN

## 2011-09-08 MED ORDER — TETANUS-DIPHTH-ACELL PERTUSSIS 5-2.5-18.5 LF-MCG/0.5 IM SUSP
INTRAMUSCULAR | Status: AC
  Filled 2011-09-08: qty 0.5

## 2011-09-08 NOTE — ED Notes (Signed)
Pt  Reports          She      Sustained  A  lacertion to  Her  r   Thumb  Today   She  Was        Washing  Dishes        And  Knife     Cut  Her  Thumb   Bleeding  Has  Subsided         Unsure  Of  Her  Last  Tetanus  Shot

## 2011-09-08 NOTE — ED Provider Notes (Signed)
History     CSN: [id number]  Arrival date & time 09/08/11  1406   First MD Initiated Contact with Patient 09/08/11 1438      Chief Complaint  Patient presents with  . Laceration    (Consider location/radiation/quality/duration/timing/severity/associated sxs/prior treatment) HPI Comments: Patient's left-hand female who sustained a laceration to the dorsal aspect of her right thumb earlier today. States that the knife was brand-new and clean. No numbness, tingling, weakness, redness, fevers. She works as a Child psychotherapist.   ROS as noted in HPI. All other ROS negative.   Patient is a 31 y.o. female presenting with skin laceration. The history is provided by the patient. No language interpreter was used.  Laceration  The incident occurred 3 to 5 hours ago. The laceration is located on the right hand. The laceration is 1 cm in size. The laceration mechanism was a a clean knife. The pain has been constant since onset. She reports no foreign bodies present. Her tetanus status is unknown.    Past Medical History  Diagnosis Date  . Anxiety     History reviewed. No pertinent past surgical history.  History reviewed. No pertinent family history.  History  Substance Use Topics  . Smoking status: Former Smoker    Quit date: 04/10/2005  . Smokeless tobacco: Never Used  . Alcohol Use: No    OB History    Grav Para Term Preterm Abortions TAB SAB Ect Mult Living                  Review of Systems  Allergies  Citalopram hydrobromide  Home Medications   Current Outpatient Rx  Name Route Sig Dispense Refill  . HYDROCODONE-ACETAMINOPHEN 5-325 MG PO TABS Oral Take 2 tablets by mouth every 4 (four) hours as needed for pain. 20 tablet 0  . IBUPROFEN 600 MG PO TABS Oral Take 1 tablet (600 mg total) by mouth every 6 (six) hours as needed for pain. 30 tablet 0  . LORAZEPAM 0.5 MG PO TABS Oral Take 1-2 tablets (0.5-1 mg total) by mouth every 8 (eight) hours as needed for anxiety. 30 tablet  0    BP 115/76  Pulse 84  Temp(Src) 98.7 F (37.1 C) (Oral)  Resp 18  SpO2 98%  Physical Exam  Nursing note and vitals reviewed. Constitutional: She is oriented to person, place, and time. She appears well-developed and well-nourished. No distress.  HENT:  Head: Normocephalic and atraumatic.  Eyes: Conjunctivae and EOM are normal.  Neck: Normal range of motion.  Cardiovascular: Normal rate.   Pulmonary/Chest: Effort normal.  Abdominal: She exhibits no distension.  Musculoskeletal: Normal range of motion.       Right hand: normal sensation noted. Normal strength noted.       Hands:      1 cm clean laceration dorsal aspect of right thumb. Refill less than 2 seconds. Flexion/Extension 5/5 compared with other fingers. Sensation grossly intact.  Neurological: She is alert and oriented to person, place, and time.  Skin: Skin is warm and dry.  Psychiatric: She has a normal mood and affect. Her behavior is normal. Judgment and thought content normal.    ED Course  LACERATION REPAIR Date/Time: 09/08/2011 4:28 PM Performed by: Luiz Blare Authorized by: Luiz Blare Consent: Verbal consent obtained. Risks and benefits: risks, benefits and alternatives were discussed Consent given by: patient Patient understanding: patient states understanding of the procedure being performed Patient consent: the patient's understanding of the procedure matches consent given Procedure  consent: procedure consent matches procedure scheduled Required items: required blood products, implants, devices, and special equipment available Patient identity confirmed: verbally with patient Time out: Immediately prior to procedure a "time out" was called to verify the correct patient, procedure, equipment, support staff and site/side marked as required. Body area: upper extremity Location details: right thumb Laceration length: 1 cm Foreign bodies: no foreign bodies Tendon involvement:  none Nerve involvement: none Vascular damage: no Anesthesia: nerve block Local anesthetic: lidocaine 2% without epinephrine Anesthetic total: 3 ml Patient sedated: no Preparation: Patient was prepped and draped in the usual sterile fashion. Irrigation solution: tap water (Chlorhexidine surgical scrub) Irrigation method: tap Amount of cleaning: extensive Debridement: none Degree of undermining: none Skin closure: 4-0 nylon Number of sutures: 2 Technique: simple Approximation: close Approximation difficulty: simple Dressing: 4x4 sterile gauze and antibiotic ointment Patient tolerance: Patient tolerated the procedure well with no immediate complications. Comments: Explored wound through full range of motion with hemostasis. No tendon, vascular involvement noted. No foreign body seen.   (including critical care time)  Labs Reviewed - No data to display No results found.   1. Laceration of right thumb     MDM  Home with pain medicine. 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 Do not think she is ideal candidate for Dermabond or Steri-Strips. Updated tetanus here. Will have her return in 10 days for suture removal. Discussed signs and symptoms that should prompt return. Patient agrees with plan.   Luiz Blare, MD 09/13/11 1131

## 2011-09-08 NOTE — Discharge Instructions (Signed)
Return in 10 days to have the sutures removed. Return sooner for signs of infection as we discussed. Keep this clean and dry. Do not get wet for  2 days at least.

## 2011-09-08 NOTE — ED Notes (Signed)
Pt evaluated for laceration to right thumb. Bleeding controlled at this time pt stated no pain when it happened but now it is throbbing. Pressure dressing applied. Pt returned to the waiting area.

## 2011-09-18 ENCOUNTER — Encounter (HOSPITAL_COMMUNITY): Payer: Self-pay | Admitting: Emergency Medicine

## 2011-09-18 ENCOUNTER — Emergency Department (INDEPENDENT_AMBULATORY_CARE_PROVIDER_SITE_OTHER)
Admission: EM | Admit: 2011-09-18 | Discharge: 2011-09-18 | Disposition: A | Discharge status: Home / Self Care | Payer: Managed Care, Other (non HMO) | Source: Home / Self Care | Attending: Emergency Medicine | Admitting: Emergency Medicine

## 2011-09-18 DIAGNOSIS — S61011A Laceration without foreign body of right thumb without damage to nail, initial encounter: Secondary | ICD-10-CM

## 2011-09-18 DIAGNOSIS — S61209A Unspecified open wound of unspecified finger without damage to nail, initial encounter: Secondary | ICD-10-CM

## 2011-09-18 NOTE — ED Notes (Signed)
PT HERE FOR X 2 STITCHES REMOVAL TO RIGHT MEDIAL THUMB S/P LACERATION 09/08/11.DENIES PAIN.TENDER TO THE TOUCH.INCISION WELL APPROX AND INTACT.NEOSPORIN USED.

## 2011-09-18 NOTE — ED Provider Notes (Signed)
Chief Complaint  Patient presents with  . Suture / Staple Removal    History of Present Illness:  The patient is a 31 year old female who lacerated her right thumb with a kitchen knife on May 31. 2 sutures were put in here and she returns today for suture removal. It's been healing up well, and there has been no signs of infection. She is able to move her thumb fully and can completely extend it.  Review of Systems:  Other than noted above, the patient denies any of the following symptoms: Systemic:  No fever or chills. Musculoskeletal:  No joint pain or decreased range of motion. Neuro:  No numbness, tingling, or weakness.  PMFSH:  Past medical history, family history, social history, meds, and allergies were reviewed.  Physical Exam:   Vital signs:  BP 113/51  Pulse 85  Temp(Src) 98.9 F (37.2 C) (Oral)  Resp 18  SpO2 99% Ext:  There is a 1 cm laceration over the dorsum of the right thumb, located over the inner phalangeal joint. She is able to completely extend her interphalangeal joint.  All joints had a full ROM without pain.  Pulses were full.  Good capillary refill in all digits.  No edema. Neurological:  Alert and oriented.  No muscle weakness.  Sensation was intact to light touch.   Procedure: Verbal informed consent was obtained.  The patient was informed of the risks and benefits of the procedure and understands and accepts.  Identity of the patient was verified verbally and by wristband.   The wound was prepped with alcohol and the sutures were removed without any difficulty. Antibiotic ointment and a Band-Aid dressing were applied and she was instructed in wound care.  Assessment:  The encounter diagnosis was Laceration of right thumb.  Plan:   1.  The following meds were prescribed:   New Prescriptions   No medications on file   2.  The patient was instructed in wound care and pain control, and handouts were given. 3.  The patient was told to return as  needed.   Reuben Likes, MD 09/18/11 [phone]

## 2011-09-18 NOTE — Discharge Instructions (Signed)

## 2011-10-02 ENCOUNTER — Other Ambulatory Visit: Payer: Self-pay | Admitting: Family Medicine

## 2011-10-03 NOTE — Telephone Encounter (Signed)
plz phoen in. 

## 2011-10-03 NOTE — Telephone Encounter (Signed)
Called in Rx as directed.  

## 2011-12-22 ENCOUNTER — Ambulatory Visit (INDEPENDENT_AMBULATORY_CARE_PROVIDER_SITE_OTHER): Payer: Managed Care, Other (non HMO) | Admitting: Internal Medicine

## 2011-12-22 ENCOUNTER — Encounter: Payer: Self-pay | Admitting: Internal Medicine

## 2011-12-22 VITALS — BP 108/60 | HR 92 | Temp 98.4°F | Wt 107.2 lb

## 2011-12-22 DIAGNOSIS — K649 Unspecified hemorrhoids: Secondary | ICD-10-CM

## 2011-12-22 NOTE — Progress Notes (Signed)
  Subjective:    Patient ID: Debra Gibbs, female    DOB: June 24, 1980, 31 y.o.   MRN: [id number]  HPI Has had problems with this for some time--for years Hemorrhoids which her gyn has treated with suppositories Cream is not as effective Has used sitz baths also  When bad, irritation makes it hard to walk or sit Still intermittent pain only though  Not constipated No blood recently---generally just on toilet paper  Current Outpatient Prescriptions on File Prior to Visit  Medication Sig Dispense Refill  . LORazepam (ATIVAN) 0.5 MG tablet take 1 to 2 tablets by mouth every 8 hours if needed for anxiety  30 tablet  0    Allergies  Allergen Reactions  . Citalopram Hydrobromide     REACTION: shakes and made her jaw tight    Past Medical History  Diagnosis Date  . Anxiety     No past surgical history on file.  No family history on file.  History   Social History  . Marital Status: Married    Spouse Name: N/A    Number of Children: 2  . Years of Education: N/A   Occupational History  . waitressing part-time, [employer] in [place]    Social History Main Topics  . Smoking status: Former Smoker    Quit date: 04/10/2005  . Smokeless tobacco: Never Used  . Alcohol Use: No  . Drug Use: Not on file  . Sexually Active: Not on file   Other Topics Concern  . Not on file   Social History Narrative  . No narrative on file   Review of Systems Appetite is fine No abdominal pain    Objective:   Physical Exam  Constitutional: She appears well-developed and well-nourished. No distress.  Genitourinary:       Very small external hemorrhoid Some tenderness on internal exam No other masses  No fistulae          Assessment & Plan:

## 2011-12-22 NOTE — Assessment & Plan Note (Signed)
Looks like it is mostly internal At times debilitating pain Longstanding Has used medical Rx, sitz baths with only temporary relief  Will set up surgeon eval

## 2011-12-28 ENCOUNTER — Telehealth: Payer: Self-pay | Admitting: Internal Medicine

## 2011-12-28 MED ORDER — LIDOCAINE HCL 2 % EX GEL: CUTANEOUS | Status: DC

## 2011-12-28 NOTE — Telephone Encounter (Signed)
Medication sent to pharmacy.  LMOVM of patient phone.

## 2011-12-28 NOTE — Telephone Encounter (Signed)
Okay to send Rx for lidocaine gel #1 tube x 0 Apply small amount to rectum tid prn Tell her it is important to use very sparingly since it can be absorbed into the blood stream  Can offer appt in Am if she wants--we have same days available

## 2011-12-28 NOTE — Telephone Encounter (Signed)
Caller: Niralya/Patient; Patient Name: Debra Gibbs; PCP: Tillman Abide [facility]); Best Callback Phone Number: [phone] Afebrile. Onset-12/28/11 Pt is having constant pain from hemorrhoids today. She is using Ibuprofen and Advil without relief. She states pain is about a 8. Pt is scheduled to see the surgeon on 01/09/12. She states now hemorrhoid is external. Emergent s/s of Rectal s/s protocol r/o. Pt to see provider within  72hrs. Pt offered an appointment for tomorrow evening with Dr. Alphonsus Sias. Pt states she would need to come in the morning and only wants to see Dr. Alphonsus Sias. No acute appointments seen in the morning.  Pt wants to know  if she can get something called in to help with discomfort this evening. For today Pharmacy would be [facility] [phone].

## 2012-01-09 ENCOUNTER — Ambulatory Visit (INDEPENDENT_AMBULATORY_CARE_PROVIDER_SITE_OTHER): Payer: Self-pay | Admitting: General Surgery

## 2012-02-05 ENCOUNTER — Ambulatory Visit (INDEPENDENT_AMBULATORY_CARE_PROVIDER_SITE_OTHER): Payer: Self-pay | Admitting: General Surgery

## 2012-02-14 ENCOUNTER — Other Ambulatory Visit: Payer: Self-pay | Admitting: Family Medicine

## 2012-02-15 ENCOUNTER — Other Ambulatory Visit: Payer: Self-pay | Admitting: Internal Medicine

## 2012-02-15 NOTE — Telephone Encounter (Signed)
Ok to refill? Last OV 12/22/11 and last refill 10/02/11.

## 2012-02-15 NOTE — Telephone Encounter (Signed)
rx called into pharmacy

## 2012-02-15 NOTE — Telephone Encounter (Signed)
Okay #30 x 0 

## 2012-02-19 ENCOUNTER — Encounter (INDEPENDENT_AMBULATORY_CARE_PROVIDER_SITE_OTHER): Payer: Self-pay | Admitting: General Surgery

## 2012-03-04 ENCOUNTER — Ambulatory Visit (INDEPENDENT_AMBULATORY_CARE_PROVIDER_SITE_OTHER): Payer: Managed Care, Other (non HMO) | Admitting: Internal Medicine

## 2012-03-04 ENCOUNTER — Encounter: Payer: Self-pay | Admitting: Internal Medicine

## 2012-03-04 VITALS — BP 100/60 | HR 90 | Temp 98.2°F | Wt 109.0 lb

## 2012-03-04 DIAGNOSIS — IMO0002 Reserved for concepts with insufficient information to code with codable children: Secondary | ICD-10-CM | POA: Insufficient documentation

## 2012-03-04 DIAGNOSIS — L089 Local infection of the skin and subcutaneous tissue, unspecified: Secondary | ICD-10-CM

## 2012-03-04 LAB — CBC WITH DIFFERENTIAL/PLATELET
Basophils Absolute: 0.1 10*3/uL (ref 0.0–0.1)
Basophils Relative: 1.2 % (ref 0.0–3.0)
Eosinophils Absolute: 0.2 10*3/uL (ref 0.0–0.7)
Eosinophils Relative: 2.6 % (ref 0.0–5.0)
HCT: 39.8 % (ref 36.0–46.0)
Hemoglobin: 13.2 g/dL (ref 12.0–15.0)
Lymphocytes Relative: 22.6 % (ref 12.0–46.0)
Lymphs Abs: 1.5 10*3/uL (ref 0.7–4.0)
MCHC: 33.3 g/dL (ref 30.0–36.0)
MCV: 93.8 fl (ref 78.0–100.0)
Monocytes Absolute: 0.5 10*3/uL (ref 0.1–1.0)
Monocytes Relative: 7.8 % (ref 3.0–12.0)
Neutro Abs: 4.3 10*3/uL (ref 1.4–7.7)
Neutrophils Relative %: 65.8 % (ref 43.0–77.0)
Platelets: 226 10*3/uL (ref 150.0–400.0)
RBC: 4.24 Mil/uL (ref 3.87–5.11)
RDW: 12.1 % (ref 11.5–14.6)
WBC: 6.5 10*3/uL (ref 4.5–10.5)

## 2012-03-04 LAB — SEDIMENTATION RATE: Sed Rate: 16 mm/hr (ref 0–22)

## 2012-03-04 LAB — C-REACTIVE PROTEIN: CRP: <0.5 mg/dL (ref 0.5–20.0)

## 2012-03-04 MED ORDER — PREDNISONE 20 MG PO TABS: 40.0000 mg | ORAL_TABLET | Freq: Every day | ORAL | Status: DC

## 2012-03-04 MED ORDER — HYDROCODONE-ACETAMINOPHEN 5-325 MG PO TABS: 1.0000 | ORAL_TABLET | Freq: Four times a day (QID) | ORAL | Status: DC | PRN

## 2012-03-04 NOTE — Assessment & Plan Note (Addendum)
With some painful nodules Likely related to her rheumatologic diagnosis---presumed RA No arthritis now  Will treat with short course of prednisone for symptom relief Tolerated MTX poorly If ongoing symptoms, may need to go back to Dr Lavenia Atlas (or she may prefer rheum in [place] now)

## 2012-03-04 NOTE — Progress Notes (Signed)
  Subjective:    Patient ID: Debra Gibbs, female    DOB: [birth year], 31 y.o.   MRN: [id number]  HPI Toes are hurting "killing me to walk" Can only wear one pair of shoes  Very sensitive under MTP joints Has noted red nodules on toes like on her hands  No active problems with arthritis lately Back seems better now  Using ibuprofen 600mg  bid---not helping much  Current Outpatient Prescriptions on File Prior to Visit  Medication Sig Dispense Refill  . LORazepam (ATIVAN) 0.5 MG tablet take 1 to 2 tablets by mouth every 8 hours if needed for anxiety  30 tablet  0    Allergies  Allergen Reactions  . Citalopram Hydrobromide     REACTION: shakes and made her jaw tight    Past Medical History  Diagnosis Date  . Anxiety     No past surgical history on file.  No family history on file.  History   Social History  . Marital Status: Married    Spouse Name: N/A    Number of Children: 2  . Years of Education: N/A   Occupational History  . waitressing part-time, [employer] in [place]    Social History Main Topics  . Smoking status: Former Smoker    Quit date: 04/10/2005  . Smokeless tobacco: Never Used  . Alcohol Use: No  . Drug Use: Not on file  . Sexually Active: Not on file   Other Topics Concern  . Not on file   Social History Narrative  . No narrative on file   Review of Systems No fever No weight loss--actually up slightly No swallowing issues No rashes    Objective:   Physical Exam  Constitutional: She appears well-developed and well-nourished. No distress.  Musculoskeletal:       Right great toe is swollen with bluish discoloration Left 5th toe also swollen Several tender nodules on left 2nd and 3rd toes  No active synovitis in any joints          Assessment & Plan:

## 2012-03-05 LAB — RHEUMATOID FACTOR: Rhuematoid fact SerPl-aCnc: <10 IU/mL (ref <=14)

## 2012-03-08 ENCOUNTER — Encounter: Payer: Self-pay | Admitting: *Deleted

## 2012-04-16 ENCOUNTER — Telehealth: Payer: Self-pay | Admitting: Internal Medicine

## 2012-04-16 NOTE — Telephone Encounter (Signed)
Patient Information:  Caller Name: Mele  Phone: [phone]  Patient: Debra Gibbs, Debra Gibbs  Gender: Female  DOB: 01-20-1981  Age: 32 Years  PCP: Tillman Abide [facility])  Pregnant: No  Office Follow Up:  Does the office need to follow up with this patient?: Yes  Instructions For The Office: Please follow up with patient regarding calling in the medications that worked in November (prednisone and Norco) or if she needs to come in to be seen.  She is happy to do either.  Drug store of choice is [facility] at [phone]  RN Note:  Two toes are affected on each foot-pain is about 9/10; These toes are swollen.  Additionally tips of great toes are sensitive.  Some of the areas that are swollen have a red to purple color.  This is mimicking the last episode she had in November.  She does have a history of RA that has been dormant.  Symptoms  Reason For Call & Symptoms: Had an issue with swelling and pain in toes in 11/13.  Provider used prednisone and mild pain reliever during this time and it worked Agricultural consultant.  With the cold weather this has started again and she wants to make him aware before it gets bad.  Reviewed Health History In EMR: Yes  Reviewed Medications In EMR: Yes  Reviewed Allergies In EMR: Yes  Reviewed Surgeries / Procedures: Yes  Date of Onset of Symptoms: 04/14/2012  Treatments Tried: Ibuprofen  Treatments Tried Worked: No OB / GYN:  LMP: Unknown  Guideline(s) Used:  Foot Pain  Disposition Per Guideline:   See Within 2 Weeks in Office  Reason For Disposition Reached:   Foot pain is a chronic symptom (recurrent or ongoing AND lasting > 4 weeks)  Advice Given:  Aggravating Factors   Overuse: People that are in professions that require prolonged standing and walking commonly report foot pain.  Shoes: Poorly fitting or overly tight shoes are the underlying cause of many painful foot conditions. High heels are bad for feet.  Pain Medicines:  For pain  relief, you can take either acetaminophen, ibuprofen, or naproxen.  They are over-the-counter (OTC) pain drugs. You can buy them at the drugstore.  Call Back If:  Swelling, redness, or fever occur  Severe pain not relieved by pain medication  Pain lasts over 7 days  You become worse.  RN Overrode Recommendation:  Patient Requests Prescription  Patient is requesting the medications that worked for her in November.  Is will to come in to be seen if needed.

## 2012-04-17 NOTE — Telephone Encounter (Signed)
Patient Information:  Caller Name: Dennys  Phone: [phone]  Patient: Debra, Gibbs  Gender: Female  DOB: [birth year]  Age: 32 Years  PCP: Tillman Abide [facility])  Pregnant: No  Office Follow Up:  Does the office need to follow up with this patient?: No  Instructions For The Office: N/A  RN Note:  Declined triage.  RN advised must be seen for steroids or pain medication.  Declined next available appointment 04/17/12 at 1515 because has kids and has to be at work at 1700.  Appointment scheduled for 04/18/12 per caller request..  Symptoms  Reason For Call & Symptoms: Called to find out MD opinion regarding appointment.  States note sent to MD after triage 04/16/12.  Called regarding arthritis flare up in both feet with severe pain ,edema of 2 toes on each foot and red/purple color of Left 4 toe.  Declined triage.  Reviewed Health History In EMR: N/A  Reviewed Medications In EMR: N/A  Reviewed Allergies In EMR: N/A  Reviewed Surgeries / Procedures: N/A  Date of Onset of Symptoms: 04/14/2012  Treatments Tried: Ibuprofen  Treatments Tried Worked: No OB / GYN:  LMP: Unknown  Guideline(s) Used:  No Protocol Available - Information Only  Disposition Per Guideline:   Home Care  Reason For Disposition Reached:   Information only question and nurse able to answer  Advice Given:  N/A  RN Overrode Recommendation:  Make Appointment  Nursing judgement based on medications requested and current symptoms  Appointment Scheduled:  04/18/2012 08:15:00 Appointment Scheduled Provider:  Eustaquio Boyden [facility])

## 2012-04-17 NOTE — Telephone Encounter (Signed)
Will see tomorrow.  Will route to PCP as fyi.

## 2012-04-17 NOTE — Telephone Encounter (Signed)
Noted Will await his eval

## 2012-04-18 ENCOUNTER — Ambulatory Visit (INDEPENDENT_AMBULATORY_CARE_PROVIDER_SITE_OTHER): Payer: Private Health Insurance - Indemnity | Admitting: Family Medicine

## 2012-04-18 ENCOUNTER — Other Ambulatory Visit: Payer: Self-pay | Admitting: Internal Medicine

## 2012-04-18 ENCOUNTER — Encounter: Payer: Self-pay | Admitting: Family Medicine

## 2012-04-18 VITALS — BP 110/78 | HR 84 | Temp 98.1°F | Ht 62.0 in | Wt 107.8 lb

## 2012-04-18 DIAGNOSIS — L089 Local infection of the skin and subcutaneous tissue, unspecified: Secondary | ICD-10-CM

## 2012-04-18 DIAGNOSIS — Z13220 Encounter for screening for lipoid disorders: Secondary | ICD-10-CM

## 2012-04-18 DIAGNOSIS — M069 Rheumatoid arthritis, unspecified: Secondary | ICD-10-CM

## 2012-04-18 DIAGNOSIS — IMO0002 Reserved for concepts with insufficient information to code with codable children: Secondary | ICD-10-CM

## 2012-04-18 MED ORDER — HYDROCODONE-ACETAMINOPHEN 5-325 MG PO TABS: 1.0000 | ORAL_TABLET | Freq: Three times a day (TID) | ORAL | Status: DC | PRN

## 2012-04-18 MED ORDER — PREDNISONE 20 MG PO TABS: ORAL_TABLET | ORAL | Status: DC

## 2012-04-18 NOTE — Progress Notes (Addendum)
  Subjective:    Patient ID: Debra Gibbs, female    DOB: 15-Oct-1980, 32 y.o.   MRN: [id number]  HPI CC: foot pain  Pleasant 31yo with h/o rheumatoid arthritis seen here 03/04/2012 by PCP with dx toe dactylitis that responded to narcotic and prednisone course who presents with similar complaint today.  Last year flare affected L foot, now currently R foot more affected.  This past weekend started feeling tightness in toes, progressively worsening.  Painful to walk on balls of feet.  Having to walk on heels.  L>R.  Feeling toe stiffness, swelling, sensitivity.  Today having all toe digits on left swelling.  Worse pain at 4th toe on left, sensitive to touch.    Other than last 2 flares, RA has been very inactive.  Wonders if trigger was cold.  Denies inciting trauma/injury to toes.  No new medicines.  No changes to diet.  No h/o gout.  Denies fevers, warmth of toes.  Did not tolerate MTX in past - made her very sick. Married with children.  Has not seen rheumatologist in 6+ yrs.  Wt Readings from Last 3 Encounters:  04/18/12 107 lb 12 oz (48.875 kg)  03/04/12 109 lb (49.442 kg)  12/22/11 107 lb 4 oz (48.648 kg)   Medications and allergies reviewed and updated in chart.  Past histories reviewed and updated if relevant as below. Patient Active Problem List  Diagnosis  . ANXIETY  . NEURODERMATITIS  . RHEUMATOID ARTHRITIS  . Hemorrhoids  . Dactylitis   Past Medical History  Diagnosis Date  . Anxiety   . Rheumatoid arthritis    No past surgical history on file. History  Substance Use Topics  . Smoking status: Former Smoker    Quit date: 04/10/2005  . Smokeless tobacco: Never Used  . Alcohol Use: No   No family history on file. Allergies  Allergen Reactions  . Citalopram Hydrobromide     REACTION: shakes and made her jaw tight   Current Outpatient Prescriptions on File Prior to Visit  Medication Sig Dispense Refill  . LORazepam (ATIVAN) 0.5 MG tablet take 1 to 2 tablets  by mouth every 8 hours if needed for anxiety  30 tablet  0     Review of Systems Per HPI    Objective:   Physical Exam  Nursing note and vitals reviewed. Constitutional: She appears well-developed and well-nourished. No distress.  Cardiovascular:  Pulses:      Dorsalis pedis pulses are 2+ on the right side, and 2+ on the left side.  Musculoskeletal:       L toes exquisitely tender to palpation, esp 2nd and 4th. 4th toe erythematous but no warmth. Mild swelling evident toes on left, esp great toe. Erythematous patches on tips of toes and one patch on R 2nd toe IP joint.  No active synovitis.       Assessment & Plan:

## 2012-04-18 NOTE — Patient Instructions (Addendum)
Return for physical with Dr. Alphonsus Sias in next few weeks.  If unable to get in with him by then, let me know for blood work. I do think this is return of toe inflammation - treat with prednisone course as well as hydrocodone for breakthrough pain as needed. Don't take ibuprofen with steroid. Pass by Marion's office to schedule rheumatology referral.

## 2012-04-18 NOTE — Assessment & Plan Note (Addendum)
2nd episode in last 1.5 months, previously arthritis was longterm quiescent. Treat with prednisone and hydrocodone for pain as pt responded very well to this in past.  Discussed steroid precautions. Given increase in flares, did recommend return to see rheum for advice on need for disease modifying agent. Pt agrees with plan. Pt may be interested in establishing with rheum in [place] as has recently moved.

## 2012-04-19 ENCOUNTER — Other Ambulatory Visit (INDEPENDENT_AMBULATORY_CARE_PROVIDER_SITE_OTHER): Payer: Private Health Insurance - Indemnity

## 2012-04-19 DIAGNOSIS — Z13220 Encounter for screening for lipoid disorders: Secondary | ICD-10-CM

## 2012-04-19 DIAGNOSIS — M069 Rheumatoid arthritis, unspecified: Secondary | ICD-10-CM

## 2012-04-19 LAB — HEPATIC FUNCTION PANEL
ALT: 26 U/L (ref 0–35)
AST: 23 U/L (ref 0–37)
Albumin: 4.2 g/dL (ref 3.5–5.2)
Alkaline Phosphatase: 55 U/L (ref 39–117)
Bilirubin, Direct: 0.1 mg/dL (ref 0.0–0.3)
Total Bilirubin: 0.8 mg/dL (ref 0.3–1.2)
Total Protein: 7.3 g/dL (ref 6.0–8.3)

## 2012-04-19 LAB — CBC WITH DIFFERENTIAL/PLATELET
Basophils Absolute: 0.1 10*3/uL (ref 0.0–0.1)
Basophils Relative: 0.6 % (ref 0.0–3.0)
Eosinophils Absolute: 0.1 10*3/uL (ref 0.0–0.7)
Eosinophils Relative: 1.1 % (ref 0.0–5.0)
HCT: 37.4 % (ref 36.0–46.0)
Hemoglobin: 12.8 g/dL (ref 12.0–15.0)
Lymphocytes Relative: 31.3 % (ref 12.0–46.0)
Lymphs Abs: 2.6 10*3/uL (ref 0.7–4.0)
MCHC: 34.1 g/dL (ref 30.0–36.0)
MCV: 92.2 fl (ref 78.0–100.0)
Monocytes Absolute: 0.8 10*3/uL (ref 0.1–1.0)
Monocytes Relative: 9.2 % (ref 3.0–12.0)
Neutro Abs: 4.8 10*3/uL (ref 1.4–7.7)
Neutrophils Relative %: 57.8 % (ref 43.0–77.0)
Platelets: 226 10*3/uL (ref 150.0–400.0)
RBC: 4.06 Mil/uL (ref 3.87–5.11)
RDW: 12.4 % (ref 11.5–14.6)
WBC: 8.4 10*3/uL (ref 4.5–10.5)

## 2012-04-19 LAB — BASIC METABOLIC PANEL
BUN: 10 mg/dL (ref 6–23)
CO2: 27 mEq/L (ref 19–32)
Calcium: 9.1 mg/dL (ref 8.4–10.5)
Chloride: 105 mEq/L (ref 96–112)
Creatinine, Ser: 0.7 mg/dL (ref 0.4–1.2)
GFR: 107.94 mL/min (ref >60.00)
Glucose, Bld: 92 mg/dL (ref 70–99)
Potassium: 3.7 mEq/L (ref 3.5–5.1)
Sodium: 139 mEq/L (ref 135–145)

## 2012-04-19 LAB — SEDIMENTATION RATE: Sed Rate: 18 mm/hr (ref 0–22)

## 2012-04-19 LAB — LIPID PANEL
Cholesterol: 208 mg/dL — ABNORMAL HIGH (ref 0–200)
HDL: 59.1 mg/dL (ref >39.00)
Total CHOL/HDL Ratio: 4
Triglycerides: 35 mg/dL (ref 0.0–149.0)
VLDL: 7 mg/dL (ref 0.0–40.0)

## 2012-04-19 LAB — LDL CHOLESTEROL, DIRECT: Direct LDL: 123.7 mg/dL

## 2012-04-22 ENCOUNTER — Encounter: Payer: Private Health Insurance - Indemnity | Admitting: Internal Medicine

## 2012-05-03 ENCOUNTER — Other Ambulatory Visit: Payer: Self-pay

## 2012-05-03 NOTE — Telephone Encounter (Signed)
Pt left v/m requesting refill on prednisone and Hydrocodone apap [facility]; pt seen 04/18/12; pt said swelling is better but arthritis still painful especially after working. Pt request refill until can see rheumotogist.Please advise.

## 2012-05-04 MED ORDER — HYDROCODONE-ACETAMINOPHEN 5-325 MG PO TABS: 1.0000 | ORAL_TABLET | Freq: Three times a day (TID) | ORAL | Status: DC | PRN

## 2012-05-04 MED ORDER — NAPROXEN 500 MG PO TABS: 500.0000 mg | ORAL_TABLET | Freq: Two times a day (BID) | ORAL | Status: DC | PRN

## 2012-05-04 NOTE — Telephone Encounter (Signed)
plz phone in refill of hydrocodone - I recommend she take NSAID instead of prednisone (sent in naprosyn). When is rheum appt?

## 2012-05-06 ENCOUNTER — Telehealth: Payer: Self-pay | Admitting: *Deleted

## 2012-05-06 NOTE — Telephone Encounter (Signed)
Rx called in as directed. Patient notified. She said her appt was last week, but she couldn't go due to schools being delayed. She is waiting to hear back from them (hopefully today) about a new appt.

## 2012-05-06 NOTE — Telephone Encounter (Signed)
Form finished No charge 

## 2012-05-06 NOTE — Telephone Encounter (Signed)
I faxed form and notified patient.

## 2012-05-06 NOTE — Telephone Encounter (Signed)
Pt dropped off [employer] form to be completed and faxed to number on the form.  Please call [phone] when completed and faxed.

## 2012-05-21 ENCOUNTER — Other Ambulatory Visit: Payer: Self-pay | Admitting: Family Medicine

## 2012-05-21 NOTE — Telephone Encounter (Signed)
Appt made with Dr Dareen Piano on 05/28/12 at 9am, patient aware.

## 2012-05-21 NOTE — Telephone Encounter (Signed)
OK to refill

## 2012-05-21 NOTE — Telephone Encounter (Signed)
rx called into pharmacy  [facility] will call rheumatology office

## 2012-05-21 NOTE — Telephone Encounter (Signed)
Okay #30 x 0  Please have Debra Gibbs contact rheumatology office so they can reschedule her consultation

## 2012-05-21 NOTE — Telephone Encounter (Signed)
Pt left v/m requesting refill Hydrocodone-apap to [facility] and also wants to know if can do rheumatology referral again; pt had to cancel last appt due to weather and rheumatologist has not called pt back. Please advise. Pt contact # [phone].

## 2012-07-17 ENCOUNTER — Encounter: Payer: Private Health Insurance - Indemnity | Admitting: Internal Medicine

## 2012-07-27 ENCOUNTER — Ambulatory Visit: Payer: Managed Care, Other (non HMO)

## 2012-07-27 ENCOUNTER — Ambulatory Visit (INDEPENDENT_AMBULATORY_CARE_PROVIDER_SITE_OTHER): Payer: Managed Care, Other (non HMO) | Admitting: Family Medicine

## 2012-07-27 VITALS — BP 111/72 | HR 103 | Temp 98.0°F | Resp 18 | Ht 63.0 in | Wt 108.0 lb

## 2012-07-27 DIAGNOSIS — M069 Rheumatoid arthritis, unspecified: Secondary | ICD-10-CM

## 2012-07-27 DIAGNOSIS — J019 Acute sinusitis, unspecified: Secondary | ICD-10-CM

## 2012-07-27 DIAGNOSIS — R059 Cough, unspecified: Secondary | ICD-10-CM

## 2012-07-27 DIAGNOSIS — R05 Cough: Secondary | ICD-10-CM

## 2012-07-27 DIAGNOSIS — J069 Acute upper respiratory infection, unspecified: Secondary | ICD-10-CM

## 2012-07-27 LAB — POCT CBC
Granulocyte percent: 66.9 %G (ref 37–80)
HCT, POC: 39.7 % (ref 37.7–47.9)
Hemoglobin: 12.8 g/dL (ref 12.2–16.2)
Lymph, poc: 1.5 (ref 0.6–3.4)
MCH, POC: 30.4 pg (ref 27–31.2)
MCHC: 32.2 g/dL (ref 31.8–35.4)
MCV: 94.2 fL (ref 80–97)
MID (cbc): 0.7 (ref 0–0.9)
MPV: 10.2 fL (ref 0–99.8)
POC Granulocyte: 4.5 (ref 2–6.9)
POC LYMPH PERCENT: 22.8 %L (ref 10–50)
POC MID %: 10.3 %M (ref 0–12)
Platelet Count, POC: 236 10*3/uL (ref 142–424)
RBC: 4.21 M/uL (ref 4.04–5.48)
RDW, POC: 12.5 %
WBC: 6.7 10*3/uL (ref 4.6–10.2)

## 2012-07-27 MED ORDER — HYDROCODONE-HOMATROPINE 5-1.5 MG/5ML PO SYRP: 5.0000 mL | ORAL_SOLUTION | Freq: Three times a day (TID) | ORAL | Status: DC | PRN

## 2012-07-27 MED ORDER — AMOXICILLIN-POT CLAVULANATE 875-125 MG PO TABS: 1.0000 | ORAL_TABLET | Freq: Two times a day (BID) | ORAL | Status: DC

## 2012-07-27 MED ORDER — ALBUTEROL SULFATE HFA 108 (90 BASE) MCG/ACT IN AERS: 2.0000 | INHALATION_SPRAY | Freq: Four times a day (QID) | RESPIRATORY_TRACT | Status: DC | PRN

## 2012-07-27 MED ORDER — BENZONATATE 100 MG PO CAPS: 200.0000 mg | ORAL_CAPSULE | Freq: Two times a day (BID) | ORAL | Status: DC | PRN

## 2012-07-27 MED ORDER — FLUTICASONE PROPIONATE 50 MCG/ACT NA SUSP: 2.0000 | Freq: Every day | NASAL | Status: DC

## 2012-07-27 NOTE — Patient Instructions (Addendum)

## 2012-07-27 NOTE — Progress Notes (Signed)
[facility]:  Office Visit  Chief Complaint:  Chief Complaint  Patient presents with  . URI  . Cough    HPI: Debra Gibbs is a 32 y.o. female who complains of  Started last Thursday, started like cold, throat pain, coughing, ribs hurt, last night mucus  Clear mucus drainage. SOB, + chills. 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  Was on methotrexate.  New Rheumatologist: Dr. Dareen Piano.    Past Medical History  Diagnosis Date  . Anxiety   . Rheumatoid arthritis    No past surgical history on file. History   Social History  . Marital Status: Married    Spouse Name: N/A    Number of Children: 2  . Years of Education: N/A   Occupational History  . waitressing part-time, [employer] in [place]    Social History Main Topics  . Smoking status: Former Smoker    Quit date: 04/10/2005  . Smokeless tobacco: Never Used  . Alcohol Use: No  . Drug Use: No  . Sexually Active: Yes    Birth Control/ Protection: IUD   Other Topics Concern  . None   Social History Narrative  . None   Family History  Problem Relation Age of Onset  . Arthritis Father    Allergies  Allergen Reactions  . Citalopram Hydrobromide     REACTION: shakes and made her jaw tight   Prior to Admission medications   Medication Sig Start Date End Date Taking? Authorizing Provider  HYDROcodone-acetaminophen (NORCO/VICODIN) 5-325 MG per tablet TAKE 1 TABLET BY MOUTH EVERY 8 HOURS AS NEEDED FOR PAIN 05/21/12  Yes Karie Schwalbe, MD  ibuprofen (ADVIL,MOTRIN) 800 MG tablet Take 800 mg by mouth every 8 (eight) hours as needed.   Yes Historical Provider, MD  LORazepam (ATIVAN) 0.5 MG tablet take 1 to 2 tablets by mouth every 8 hours if needed for anxiety 02/14/12   Karie Schwalbe, MD     ROS: The patient denies fevers,  night sweats, unintentional weight loss,  palpitations,  dyspnea on exertion, nausea, vomiting, abdominal pain, dysuria, hematuria, melena, numbness,  weakness, or tingling.   All other systems have been reviewed and were otherwise negative with the exception of those mentioned in the HPI and as above.    PHYSICAL EXAM: Filed Vitals:   07/27/12 1137  BP: 111/72  Pulse: 103  Temp: 98 F (36.7 C)  Resp: 18   Filed Vitals:   07/27/12 1137  Height: 5\' 3"  (1.6 m)  Weight: 108 lb (48.988 kg)   Body mass index is 19.14 kg/(m^2).  General: Alert, no acute distress HEENT:  Normocephalic, atraumatic, oropharynx patent. + bil max sinus pressure/pain, TM nl. No exudates.  Cardiovascular:  Regular rate and rhythm, no rubs murmurs or gallops.  No Carotid bruits, radial pulse intact. No pedal edema.  Respiratory: Clear to auscultation bilaterally.  No wheezes, rales, or rhonchi.  No cyanosis, no use of accessory musculature GI: No organomegaly, abdomen is soft and non-tender, positive bowel sounds.  No masses. Skin: No rashes. Neurologic: Facial musculature symmetric. Psychiatric: Patient is appropriate throughout our interaction. Lymphatic: No cervical lymphadenopathy Musculoskeletal: Gait intact.   LABS: Results for orders placed in visit on 07/27/12  POCT CBC      Result Value Range   WBC 6.7  4.6 - 10.2 K/uL   Lymph, poc 1.5  0.6 - 3.4   POC LYMPH PERCENT 22.8  10 - 50 %L  MID (cbc) 0.7  0 - 0.9   POC MID % 10.3  0 - 12 %M   POC Granulocyte 4.5  2 - 6.9   Granulocyte percent 66.9  37 - 80 %G   RBC 4.21  4.04 - 5.48 M/uL   Hemoglobin 12.8  12.2 - 16.2 g/dL   HCT, POC 16.1  09.6 - 47.9 %   MCV 94.2  80 - 97 fL   MCH, POC 30.4  27 - 31.2 pg   MCHC 32.2  31.8 - 35.4 g/dL   RDW, POC 04.5     Platelet Count, POC 236  142 - 424 K/uL   MPV 10.2  0 - 99.8 fL     EKG/XRAY:   Primary read interpreted by Dr. Conley Rolls at [facility]. No acute cardiopulmonary process   ASSESSMENT/PLAN: Encounter Diagnoses  Name Primary?  . URI, acute Yes  . Cough   . Rheumatoid arthritis   . Acute sinusitis     Rx augmentin, flonase, tessalon  perles, hycodan, albuterol prn F/u prn   LE, THAO PHUONG, DO 07/27/2012 1:12 PM

## 2012-08-01 ENCOUNTER — Telehealth: Payer: Self-pay

## 2012-08-01 MED ORDER — FLUCONAZOLE 150 MG PO TABS: 150.0000 mg | ORAL_TABLET | Freq: Once | ORAL | Status: DC

## 2012-08-01 NOTE — Telephone Encounter (Signed)
Left message to advise this was done for her.

## 2012-08-01 NOTE — Telephone Encounter (Signed)
Pt came in this past weekend and was given a antibiotic and she believes that she has a yeast infection. She is wanting to know if something could be called in or does she have to come in to be seen. Call back number is [phone] Pharmacy she uses is [facility] on [address]

## 2012-10-24 ENCOUNTER — Other Ambulatory Visit: Payer: Self-pay | Admitting: Internal Medicine

## 2012-10-25 NOTE — Telephone Encounter (Signed)
Okay #30 x 0 She should reschedule the physical

## 2012-10-25 NOTE — Telephone Encounter (Signed)
rx called into pharmacy Left message on cell phone voicemail to reschedule physical

## 2013-05-08 ENCOUNTER — Ambulatory Visit (INDEPENDENT_AMBULATORY_CARE_PROVIDER_SITE_OTHER): Payer: Managed Care, Other (non HMO) | Admitting: Obstetrics

## 2013-05-08 ENCOUNTER — Encounter: Payer: Self-pay | Admitting: Obstetrics

## 2013-05-08 VITALS — BP 107/72 | HR 77 | Temp 97.5°F | Ht 62.0 in | Wt 110.0 lb

## 2013-05-08 DIAGNOSIS — Z30432 Encounter for removal of intrauterine contraceptive device: Secondary | ICD-10-CM

## 2013-05-08 DIAGNOSIS — Z0000 Encounter for general adult medical examination without abnormal findings: Secondary | ICD-10-CM | POA: Insufficient documentation

## 2013-05-08 DIAGNOSIS — D259 Leiomyoma of uterus, unspecified: Secondary | ICD-10-CM | POA: Insufficient documentation

## 2013-05-08 DIAGNOSIS — Z3009 Encounter for other general counseling and advice on contraception: Secondary | ICD-10-CM

## 2013-05-08 MED ORDER — NORGESTIMATE-ETH ESTRADIOL 0.25-35 MG-MCG PO TABS: 1.0000 | ORAL_TABLET | Freq: Every day | ORAL | Status: DC

## 2013-05-08 MED ORDER — PRENATAL 19 PO TABS: 1.0000 | ORAL_TABLET | Freq: Every day | ORAL | Status: DC

## 2013-05-08 NOTE — Progress Notes (Signed)
Subjective:     Debra Gibbs is a 33 y.o. female here for a routine exam.  Current complaints: Mirena IUD removal. Pt states she has had it in for 5 years. Pt is interested on the Nexplanon.  Personal health questionnaire reviewed: yes.   Gynecologic History No LMP recorded. Patient is not currently having periods (Reason: IUD). Contraception: IUD Last Pap: 2011. Results were: normal  Obstetric History OB History  Gravida Para Term Preterm AB SAB TAB Ectopic Multiple Living  4 2 2  2 2    2     # Outcome Date GA Lbr Len/2nd Weight Sex Delivery Anes PTL Lv  4 TRM 12/22/07 [redacted]w[redacted]d  7 lb 9 oz (3.43 kg) F SVD EPI  Y  3 TRM 04/19/06 [redacted]w[redacted]d  7 lb 13 oz (3.544 kg) F SVD EPI  Y  2 SAB 2006 [redacted]w[redacted]d         1 SAB 2006 [redacted]w[redacted]d              The following portions of the patient's history were reviewed and updated as appropriate: allergies, current medications, past family history, past medical history, past social history, past surgical history and problem list.  Review of Systems Pertinent items are noted in HPI.    Objective:    BP 107/72  Pulse 77  Temp(Src) 97.5 F (36.4 C)  Ht 5\' 2"  (1.575 m)  Wt 110 lb (49.896 kg)  BMI 20.11 kg/m2 Abdomen: normal findings: soft, non-tender Pelvic: cervix normal in appearance, external genitalia normal, no adnexal masses or tenderness, no cervical motion tenderness, rectovaginal septum normal, uterus normal size, shape, and consistency, vagina normal without discharge and IUD string visible and grasped with dressing forcep, and IUD removed, intact.    Assessment:    IUD surveillance / removal.    Plan:    Education reviewed: Contraceptive options. Contraception: OCP (estrogen/progesterone). Ortho Cycl[facility] Rx   F/U for annual exam.

## 2013-05-12 ENCOUNTER — Encounter: Payer: Self-pay | Admitting: Obstetrics

## 2013-05-12 ENCOUNTER — Ambulatory Visit: Payer: Managed Care, Other (non HMO) | Admitting: Obstetrics

## 2013-05-13 ENCOUNTER — Other Ambulatory Visit: Payer: Self-pay | Admitting: Rheumatology

## 2013-05-13 DIAGNOSIS — M79673 Pain in unspecified foot: Secondary | ICD-10-CM

## 2013-06-10 ENCOUNTER — Other Ambulatory Visit: Payer: Private Health Insurance - Indemnity

## 2013-06-23 ENCOUNTER — Ambulatory Visit
Admission: RE | Admit: 2013-06-23 | Discharge: 2013-06-23 | Disposition: A | Discharge status: Home / Self Care | Payer: Private Health Insurance - Indemnity | Source: Ambulatory Visit | Attending: Rheumatology | Admitting: Rheumatology

## 2013-06-23 DIAGNOSIS — M79673 Pain in unspecified foot: Secondary | ICD-10-CM

## 2013-06-23 MED ORDER — GADOBENATE DIMEGLUMINE 529 MG/ML IV SOLN
10.0000 mL | Freq: Once | INTRAVENOUS | Status: AC | PRN
  Administered 2013-06-23: 10 mL via INTRAVENOUS

## 2014-02-09 ENCOUNTER — Encounter: Payer: Self-pay | Admitting: Obstetrics

## 2014-02-14 ENCOUNTER — Encounter: Payer: Self-pay | Admitting: Family Medicine

## 2014-02-14 ENCOUNTER — Ambulatory Visit (INDEPENDENT_AMBULATORY_CARE_PROVIDER_SITE_OTHER): Payer: Private Health Insurance - Indemnity | Admitting: Family Medicine

## 2014-02-14 VITALS — BP 92/62 | Temp 99.0°F | Wt 109.0 lb

## 2014-02-14 DIAGNOSIS — J020 Streptococcal pharyngitis: Secondary | ICD-10-CM

## 2014-02-14 DIAGNOSIS — O000 Abdominal pregnancy: Secondary | ICD-10-CM

## 2014-02-14 DIAGNOSIS — O0001 Abdominal pregnancy with intrauterine pregnancy: Secondary | ICD-10-CM

## 2014-02-14 LAB — POCT RAPID STREP A (OFFICE): Rapid Strep A Screen: POSITIVE — AB

## 2014-02-14 MED ORDER — AMOXICILLIN 500 MG PO CAPS: 1000.0000 mg | ORAL_CAPSULE | Freq: Two times a day (BID) | ORAL | Status: DC

## 2014-02-14 MED ORDER — PRENATAL VITAMINS 28-0.8 MG PO TABS: ORAL_TABLET | ORAL | Status: DC

## 2014-02-14 NOTE — Addendum Note (Signed)
Addended by: Owens Loffler on: 02/14/2014 02:42 PM   Modules accepted: Miquel Dunn

## 2014-02-14 NOTE — Progress Notes (Signed)
Dr. Frederico Hamman T. Altan Kraai, MD, [facility] [place], [address] Phone: [phone] Fax: [phone]  02/14/2014  Patient: Debra Gibbs, MRN: [id number], DOB: 01-15-1981, 33 y.o.  Primary Physician:  Viviana Simpler, MD  Chief Complaint: Sore Throat  Subjective:   Debra Gibbs is a 33 y.o. very pleasant female patient who presents with the following:  Strep +  Throat is the worst. No cough or runny nose.   The patient is well-known to me, and she presents today with a several day history of very significant sore throat.  She is also has significant achiness, general feeling poor, and painful lymphadenopathy in her neck.  She is not having any significant rhinorrhea or cough.  No significant earache.  She also very recently found out that she is pregnant, and she is only a few weeks along.  Past Medical History, Surgical History, Social History, Family History, Problem List, Medications, and Allergies have been reviewed and updated if relevant.  ROS: GEN: Acute illness details above GI: Tolerating PO intake GU: maintaining adequate hydration and urination Pulm: No SOB Interactive and getting along well at home.  Otherwise, ROS is as per the HPI.   Objective:   BP 92/62 mmHg  Temp(Src) 99 F (37.2 C)  Wt 109 lb (49.442 kg)   Gen: WDWN, NAD; A & O x3, cooperative. Pleasant.Globally Non-toxic HEENT: Normocephalic and atraumatic. Throat: grossly unremarkable without significant pus, but mild redness R TM clear, L TM - good landmarks, No fluid present. rhinnorhea. No frontal or maxillary sinus T. MMM NECK: Anterior cervical  LAD is present - TTP CV: RRR, No M/G/R, cap refill <2 sec PULM: Breathing comfortably in no respiratory distress. no wheezing, crackles, rhonchi ABD: S,NT,ND,+BS. No HSM. No rebound. EXT: No c/c/e PSYCH: Friendly, good eye contact    Laboratory and Imaging Data: Results for orders placed  or performed in visit on 02/14/14  POCT rapid strep A  Result Value Ref Range   Rapid Strep A Screen Positive (A) Negative     Assessment and Plan:   Streptococcal sore throat - Plan: POCT rapid strep A  Abdominal pregnancy with intrauterine pregnancy  I called the inpatient pharmacy to confirm that amoxicillin or penicillin is acceptable in the 1st trimester of pregnancy.  This is pregnancy category B, and they told me that it is a preferred agent for treatment of chlamydia in pregnancy, therefore we felt that this was perfectly safe for the treatment of strep throat.  Also sent the patient in some prenatal vitamins.  Follow-up: No Follow-up on file.  New Prescriptions   AMOXICILLIN (AMOXIL) 500 MG CAPSULE    Take 2 capsules (1,000 mg total) by mouth 2 (two) times daily.   Orders Placed This Encounter  Procedures  . POCT rapid strep A    Signed,  Ripken Rekowski T. Corryn Madewell, MD   Patient's Medications  New Prescriptions   AMOXICILLIN (AMOXIL) 500 MG CAPSULE    Take 2 capsules (1,000 mg total) by mouth 2 (two) times daily.  Previous Medications   HYDROCODONE-ACETAMINOPHEN (NORCO/VICODIN) 5-325 MG PER TABLET    TAKE 1 TABLET BY MOUTH EVERY 8 HOURS AS NEEDED FOR PAIN   NORGESTIMATE-ETHINYL ESTRADIOL (ORTHO-CYCLEN,SPRINTEC,PREVIFEM) 0.25-35 MG-MCG TABLET    Take 1 tablet by mouth daily.   PRENATAL VIT-DSS-FE FUM-FA (PRENATAL 19) TABLET    Take 1 tablet by mouth daily.   XELJANZ 5 MG TABS    1 tablet.  Modified Medications  No medications on file  Discontinued Medications   No medications on file

## 2014-03-03 ENCOUNTER — Other Ambulatory Visit (INDEPENDENT_AMBULATORY_CARE_PROVIDER_SITE_OTHER): Payer: Managed Care, Other (non HMO)

## 2014-03-03 VITALS — BP 97/68 | HR 78 | Temp 98.8°F | Ht 62.0 in | Wt 107.0 lb

## 2014-03-03 DIAGNOSIS — Z3200 Encounter for pregnancy test, result unknown: Secondary | ICD-10-CM

## 2014-03-03 LAB — POCT URINE PREGNANCY: Preg Test, Ur: POSITIVE

## 2014-03-31 ENCOUNTER — Encounter: Payer: Managed Care, Other (non HMO) | Admitting: Obstetrics

## 2014-04-10 NOTE — L&D Delivery Note (Signed)
Delivery Note This is a 34 year old G 5 P2 who was admitted for Not in labor. and IOL elective at term. She progressed with Cytotec to complete with epidural, had one dose of nubain/phenergan to the second stage of labor.  She pushed for 10 min. At 5:12 AM a viable female was delivered via Vaginal, Spontaneous Delivery (Presentation: ; Occiput Anterior) over an intact perineum. Infant placed on maternal abdomen.  Delayed cord clamping for 4-5 minutes was done.  Cord double clamped and cut.   APGAR: 9, 9; weight  .   Placenta status: Intact, Spontaneous, Schultz,with a 3 vessel cord. .  Cord:  with the following complications: none.  Cord pH: none.  Cytotec given PR for brisk bleeding after the placenta was delivered.  Pitocin IM given d/t IV infiltrating.    The uterus was firm bleeding stable.    EBL was approximately 274ml.    Placenta and umbilical artery blood gas were not sent.  There were no complications during the procedure.  Mom and baby skin to skin following delivery. Left in stable condition.   Anesthesia: Epidural  Episiotomy: None Lacerations:  None Suture Repair: None Est. Blood Loss (mL):  About 200 ml  Mom to postpartum.  Baby to Couplet care / Skin to Skin.  Rachelle A Denney 10/14/2014, 5:44 AM

## 2014-04-15 ENCOUNTER — Encounter: Payer: Self-pay | Admitting: Obstetrics

## 2014-04-15 ENCOUNTER — Ambulatory Visit (INDEPENDENT_AMBULATORY_CARE_PROVIDER_SITE_OTHER): Payer: Managed Care, Other (non HMO) | Admitting: Obstetrics

## 2014-04-15 VITALS — BP 103/68 | HR 82 | Wt 111.0 lb

## 2014-04-15 DIAGNOSIS — Z3201 Encounter for pregnancy test, result positive: Secondary | ICD-10-CM

## 2014-04-15 DIAGNOSIS — Z3482 Encounter for supervision of other normal pregnancy, second trimester: Secondary | ICD-10-CM

## 2014-04-15 LAB — POCT URINALYSIS DIPSTICK
Bilirubin, UA: NEGATIVE
Blood, UA: NEGATIVE
Glucose, UA: NEGATIVE
Ketones, UA: NEGATIVE
Leukocytes, UA: NEGATIVE
Nitrite, UA: NEGATIVE
Protein, UA: NEGATIVE
Spec Grav, UA: 1.015
Urobilinogen, UA: NEGATIVE
pH, UA: 7

## 2014-04-15 NOTE — Progress Notes (Signed)
Subjective:    Debra Gibbs is being seen today for her first obstetrical visit.  This is not a planned pregnancy. She is at [redacted]w[redacted]d gestation. Her obstetrical history is significant for none. Relationship with FOB: not asked. Patient does intend to breast feed. Pregnancy history fully reviewed.  The information documented in the HPI was reviewed and verified.  Menstrual History: OB History    Gravida Para Term Preterm AB TAB SAB Ectopic Multiple Living   5 2 2  2  2   2        Patient's last menstrual period was 01/11/2014.    Past Medical History  Diagnosis Date  . Anxiety   . Rheumatoid arthritis(714.0)     History reviewed. No pertinent past surgical history.   (Not in a hospital admission) Allergies  Allergen Reactions  . Citalopram Hydrobromide     REACTION: shakes and made her jaw tight    History  Substance Use Topics  . Smoking status: Former Smoker    Quit date: 04/10/2005  . Smokeless tobacco: Never Used  . Alcohol Use: No    Family History  Problem Relation Age of Onset  . Arthritis Father      Review of Systems Constitutional: negative for weight loss Gastrointestinal: negative for vomiting Genitourinary:negative for genital lesions and vaginal discharge and dysuria Musculoskeletal:negative for back pain Behavioral/Psych: negative for abusive relationship, depression, illegal drug usage and tobacco use    Objective:    BP 103/68 mmHg  Pulse 82  Wt 111 lb (50.349 kg)  LMP 01/11/2014 General Appearance:    Alert, cooperative, no distress, appears stated age  Head:    Normocephalic, without obvious abnormality, atraumatic  Eyes:    PERRL, conjunctiva/corneas clear, EOM's intact, fundi    benign, both eyes  Ears:    Normal TM's and external ear canals, both ears  Nose:   Nares normal, septum midline, mucosa normal, no drainage    or sinus tenderness  Throat:   Lips, mucosa, and tongue normal; teeth and gums normal  Neck:   Supple, symmetrical,  trachea midline, no adenopathy;    thyroid:  no enlargement/tenderness/nodules; no carotid   bruit or JVD  Back:     Symmetric, no curvature, ROM normal, no CVA tenderness  Lungs:     Clear to auscultation bilaterally, respirations unlabored  Chest Wall:    No tenderness or deformity   Heart:    Regular rate and rhythm, S1 and S2 normal, no murmur, rub   or gallop  Breast Exam:    No tenderness, masses, or nipple abnormality  Abdomen:     Soft, non-tender, bowel sounds active all four quadrants,    no masses, no organomegaly  Genitalia:    Normal female without lesion, discharge or tenderness  Extremities:   Extremities normal, atraumatic, no cyanosis or edema  Pulses:   2+ and symmetric all extremities  Skin:   Skin color, texture, turgor normal, no rashes or lesions  Lymph nodes:   Cervical, supraclavicular, and axillary nodes normal  Neurologic:   CNII-XII intact, normal strength, sensation and reflexes    throughout      Lab Review Urine pregnancy test Labs reviewed yes Radiologic studies reviewed yes Assessment:    Pregnancy at [redacted]w[redacted]d weeks    Plan:      Prenatal vitamins.  Counseling provided regarding continued use of seat belts, cessation of alcohol consumption, smoking or use of illicit drugs; infection precautions i.e., influenza/TDAP immunizations, toxoplasmosis,CMV, parvovirus, listeria and varicella;  workplace safety, exercise during pregnancy; routine dental care, safe medications, sexual activity, hot tubs, saunas, pools, travel, caffeine use, fish and methlymercury, potential toxins, hair treatments, varicose veins Weight gain recommendations per IOM guidelines reviewed: underweight/BMI< 18.5--> gain 28 - 40 lbs; normal weight/BMI 18.5 - 24.9--> gain 25 - 35 lbs; overweight/BMI 25 - 29.9--> gain 15 - 25 lbs; obese/BMI >30->gain  11 - 20 lbs Problem list reviewed and updated. FIRST/CF mutation testing/NIPT/QUAD SCREEN/fragile X/Ashkenazi Jewish population  testing/Spinal muscular atrophy discussed: requested. Role of ultrasound in pregnancy discussed; fetal survey: requested. Amniocentesis discussed: not indicated. VBAC calculator score: VBAC consent form provided No orders of the defined types were placed in this encounter.   Orders Placed This Encounter  Procedures  . Culture, OB Urine  . SureSwab, Vaginosis/Vaginitis Plus  . Obstetric panel  . HIV antibody  . Hemoglobinopathy evaluation  . Varicella zoster antibody, IgG  . Vit D  25 hydroxy (rtn osteoporosis monitoring)  . TSH  . POCT urinalysis dipstick    Follow up in 3 weeks.

## 2014-04-16 LAB — OBSTETRIC PANEL
Antibody Screen: NEGATIVE
Basophils Absolute: 0.1 10*3/uL (ref 0.0–0.1)
Basophils Relative: 1 % (ref 0–1)
Eosinophils Absolute: 0.2 10*3/uL (ref 0.0–0.7)
Eosinophils Relative: 3 % (ref 0–5)
HCT: 35.7 % — ABNORMAL LOW (ref 36.0–46.0)
Hemoglobin: 12.3 g/dL (ref 12.0–15.0)
Hepatitis B Surface Ag: NEGATIVE
Lymphocytes Relative: 20 % (ref 12–46)
Lymphs Abs: 1.7 10*3/uL (ref 0.7–4.0)
MCH: 31.9 pg (ref 26.0–34.0)
MCHC: 34.5 g/dL (ref 30.0–36.0)
MCV: 92.7 fL (ref 78.0–100.0)
MPV: 10.5 fL (ref 8.6–12.4)
Monocytes Absolute: 0.4 10*3/uL (ref 0.1–1.0)
Monocytes Relative: 5 % (ref 3–12)
Neutro Abs: 5.9 10*3/uL (ref 1.7–7.7)
Neutrophils Relative %: 71 % (ref 43–77)
Platelets: 288 10*3/uL (ref 150–400)
RBC: 3.85 MIL/uL — ABNORMAL LOW (ref 3.87–5.11)
RDW: 13.7 % (ref 11.5–15.5)
Rh Type: POSITIVE
Rubella: 7.06 Index — ABNORMAL HIGH (ref <0.90)
WBC: 8.3 10*3/uL (ref 4.0–10.5)

## 2014-04-16 LAB — TSH: TSH: 0.559 u[IU]/mL (ref 0.350–4.500)

## 2014-04-16 LAB — HIV ANTIBODY (ROUTINE TESTING W REFLEX): HIV 1&2 Ab, 4th Generation: NONREACTIVE

## 2014-04-16 LAB — VITAMIN D 25 HYDROXY (VIT D DEFICIENCY, FRACTURES): Vit D, 25-Hydroxy: 20 ng/mL — ABNORMAL LOW (ref 30–100)

## 2014-04-16 LAB — VARICELLA ZOSTER ANTIBODY, IGG: Varicella IgG: 632.3 Index — ABNORMAL HIGH (ref <135.00)

## 2014-04-17 LAB — PAP IG W/ RFLX HPV ASCU

## 2014-04-17 LAB — CULTURE, OB URINE
Colony Count: NO GROWTH
Organism ID, Bacteria: NO GROWTH

## 2014-04-17 LAB — HEMOGLOBINOPATHY EVALUATION
Hemoglobin Other: 0 %
Hgb A2 Quant: 2.8 % (ref 2.2–3.2)
Hgb A: 97.2 % (ref 96.8–97.8)
Hgb F Quant: 0 % (ref 0.0–2.0)
Hgb S Quant: 0 %

## 2014-04-18 LAB — SURESWAB, VAGINOSIS/VAGINITIS PLUS
Atopobium vaginae: NOT DETECTED Log (cells/mL)
C. albicans, DNA: NOT DETECTED
C. glabrata, DNA: NOT DETECTED
C. parapsilosis, DNA: NOT DETECTED
C. trachomatis RNA, TMA: NOT DETECTED
C. tropicalis, DNA: NOT DETECTED
Gardnerella vaginalis: 5.7 Log (cells/mL)
LACTOBACILLUS SPECIES: DETECTED Log (cells/mL)
MEGASPHAERA SPECIES: NOT DETECTED Log (cells/mL)
N. gonorrhoeae RNA, TMA: NOT DETECTED
T. vaginalis RNA, QL TMA: NOT DETECTED

## 2014-04-20 ENCOUNTER — Encounter: Payer: Self-pay | Admitting: Internal Medicine

## 2014-04-20 ENCOUNTER — Ambulatory Visit (INDEPENDENT_AMBULATORY_CARE_PROVIDER_SITE_OTHER): Payer: Private Health Insurance - Indemnity | Admitting: Internal Medicine

## 2014-04-20 ENCOUNTER — Encounter: Payer: Self-pay | Admitting: *Deleted

## 2014-04-20 VITALS — BP 110/70 | HR 90 | Temp 98.4°F | Wt 110.0 lb

## 2014-04-20 DIAGNOSIS — J0141 Acute recurrent pansinusitis: Secondary | ICD-10-CM | POA: Insufficient documentation

## 2014-04-20 DIAGNOSIS — J0110 Acute frontal sinusitis, unspecified: Secondary | ICD-10-CM

## 2014-04-20 MED ORDER — AMOXICILLIN 500 MG PO TABS: 1000.0000 mg | ORAL_TABLET | Freq: Two times a day (BID) | ORAL | Status: DC

## 2014-04-20 NOTE — Assessment & Plan Note (Signed)
Still seems viral Discussed supportive care If worsens, can fill Rx for amoxil

## 2014-04-20 NOTE — Progress Notes (Signed)
Pre visit review using our clinic review tool, if applicable. No additional management support is needed unless otherwise documented below in the visit note. 

## 2014-04-20 NOTE — Patient Instructions (Signed)
Please start the antibiotic if you are worsening, or if you are not feeling better by the weekend

## 2014-04-20 NOTE — Progress Notes (Signed)
   Subjective:    Patient ID: Debra Gibbs, female    DOB: [birth year], 34 y.o.   MRN: [id number]  HPI Here due to respiratory illness  Headache started 4 days ago Tylenol not helping--- in frontal area Nasal stuffiness, sore throat, cough (worse at night)--awakens her at night  Now having pain everywhere---achyness everywhere  Tried allowed robitussin--not helpful Low grade fever yesterday--up to 100.8 Slight SOB-- relates it to being "out of energy" Popping in ears  No current outpatient prescriptions on file prior to visit.   No current facility-administered medications on file prior to visit.    Allergies  Allergen Reactions  . Citalopram Hydrobromide     REACTION: shakes and made her jaw tight    Past Medical History  Diagnosis Date  . Anxiety   . Rheumatoid arthritis(714.0)     No past surgical history on file.  Family History  Problem Relation Age of Onset  . Arthritis Father     History   Social History  . Marital Status: Married    Spouse Name: N/A    Number of Children: 2  . Years of Education: N/A   Occupational History  . waitressing part-time, [employer] in [place]  . Smoking status: Former Smoker    Quit date: 04/10/2005  . Smokeless tobacco: Never Used  . Alcohol Use: No  . Drug Use: No  . Sexual Activity:    Partners: Male    Birth Control/ Protection: None   Other Topics Concern  . Not on file   Social History Narrative   Review of Systems No vomiting or diarrhea Appetite off due to pregnancy--not illness    Objective:   Physical Exam  Constitutional: She appears well-developed and well-nourished.  Dry cough Uncomfortable looking but no distress  HENT:  Moderate frontal tenderness and nasal inflammation Slight pharyngeal inflammation but no exudates TMs normal  Neck: Normal range of motion. Neck supple.  Pulmonary/Chest: Effort normal and breath sounds normal. No respiratory distress. She  has no wheezes. She has no rales.  Lymphadenopathy:    She has no cervical adenopathy.          Assessment & Plan:

## 2014-04-23 ENCOUNTER — Telehealth: Payer: Self-pay

## 2014-04-23 NOTE — Telephone Encounter (Signed)
Pt left v/m; pt was seen 04/20/14, pt is taking robitussin but pt wants to know if can take something else for cough; pt not able to sleep at night due to cough and congestion. Pt is pregnant.[facility]. Pt request cb.

## 2014-04-23 NOTE — Telephone Encounter (Signed)
Spoke with patient and advised results   

## 2014-04-23 NOTE — Telephone Encounter (Signed)
There is not great options after the robitussin She can try honey--that often helps

## 2014-04-27 ENCOUNTER — Telehealth: Payer: Self-pay

## 2014-04-27 NOTE — Telephone Encounter (Signed)
Left detailed message on VM with results, advised pt to call if any questions

## 2014-04-27 NOTE — Telephone Encounter (Signed)
Please let her know that she should start the amoxicillin antibiotic if she hasn't already

## 2014-04-27 NOTE — Telephone Encounter (Signed)
PLEASE NOTE: All timestamps contained within this report are represented as [place] Standard Time. CONFIDENTIALTY NOTICE: This fax transmission is intended only for the addressee. It contains information that is legally privileged, confidential or otherwise protected from use or disclosure. If you are not the intended recipient, you are strictly prohibited from reviewing, disclosing, copying using or disseminating any of this information or taking any action in reliance on or regarding this information. If you have received this fax in error, please notify us immediately by telephone so that we can arrange for its return to [place]. Phone: [phone], Toll-Free: [phone], Fax: [phone] Page: 1 of 2 Call Id: [id number] [place] Patient Name: Debra Gibbs Gender: Female DOB: 1981/01/30 Age: 34 Y 25 D Return Phone Number: [phone] (Primary) Address: City/State/Zip: [place] [address] Client [facility] Night - Client Client Site [place] Physician Viviana Simpler Contact Type Call Call Type Triage / Clinical Relationship To Patient Self Return Phone Number [phone] (Primary) Chief Complaint Eye Pus Or Discharge Initial Comment Caller states was seen for viral infection on Monday and has been getting worse. thinks she has pink eye, and getting bloody noses [place] Not Listed Homecare PreDisposition Did not know what to do Nurse Assessment Nurse: Venetia Maxon, RN, Manuela Schwartz Date/Time (Eastern Time): 04/25/2014 8:42:07 AM Confirm and document reason for call. If symptomatic, describe symptoms. ---Caller states was seen for viral infection on Monday (URI) and has been getting worse. thinks she has pink eye, and getting bloody noses. She is [redacted] wks pregnant MD told her to take Robitussin. She thinks she is worse. Fever last w/e. no  fever today. This am her RT eye is pink. Lids are not swollen. some crust. Has the patient traveled out of the country within the last 30 days? ---No Does the patient require triage? ---Yes Related visit to physician within the last 2 weeks? ---No Does the PT have any chronic conditions? (i.e. diabetes, asthma, etc.) ---Yes List chronic conditions. ---allergies G 3 P 2 Did the patient indicate they were pregnant? ---Yes How many weeks gestation? ---15 wks Have you felt decreased fetal movement? ---Early Pregnancy - No Fetal Movement Felt Yet Guidelines Guideline Title Affirmed Question Affirmed Notes Nurse Date/Time Eilene Ghazi Time) Common Cold Cold with no complications (all triage questions negative) Whitaker, RN, Manuela Schwartz 04/25/2014 8:45:35 AM Disp. Time Eilene Ghazi Time) Disposition Final User PLEASE NOTE: All timestamps contained within this report are represented as [place] Standard Time. CONFIDENTIALTY NOTICE: This fax transmission is intended only for the addressee. It contains information that is legally privileged, confidential or otherwise protected from use or disclosure. If you are not the intended recipient, you are strictly prohibited from reviewing, disclosing, copying using or disseminating any of this information or taking any action in reliance on or regarding this information. If you have received this fax in error, please notify us immediately by telephone so that we can arrange for its return to [place]. Phone: [phone], Toll-Free: [phone], Fax: [phone] Page: 2 of 2 Call Id: [id number] 04/25/2014 8:50:02 AM Home Care Yes Venetia Maxon, RN, Edwena Bunde Understands: Yes Disagree/Comply: Comply Care Advice Given Per Guideline HOME CARE: You should be able to treat this at home. REASSURANCE: * It sounds like an uncomplicated cold that we can treat at home. FOR A STUFFY NOSE - USE NASAL WASHES: * Introduction: Saline (salt water) nasal irrigation (nasal wash) is  an effective and  simple home remedy for treating stuffy nose and sinus congestion. The nose can be irrigated by pouring, spraying, or squirting salt water into the nose and then letting it run back out. * How it Helps: The salt water rinses out excess mucus, washes out any irritants (dust, allergens) that might be present, and moistens the nasal cavity. * Methods: There are several ways to perform nasal irrigation. You can use a saline nasal spray bottle (available over-the-counter), a rubber ear syringe, a medical syringe without the needle, or a NETI POT. STEP-BY-STEP INSTRUCTIONS: * STEP 1: Lean over a sink. * STEP 2: Gently squirt or spray warm salt water into one of your nostrils. * Do not take these medications if you are pregnant. * Do not take these medications if you have high blood pressure, heart disease, prostate enlargement, or an overactive thyroid. CAUTION - NASAL DECONGESTANTS: * Sore throat: throat lozenges, hard candy or warm chicken broth. * For muscle aches, headaches, or moderate fever (over 101 degrees F) (38.9 C) use acetaminophen every 4 hours. TREATMENT FOR ASSOCIATED SYMPTOMS OF COLDS: * Cough: use cough drops. * Hydrate: drink extra liquids. HUMIDIFIER: If the air in your home is dry, use a humidifier. EXPECTED COURSE: * Fever 2-3 days * Nasal discharge 7-14 days * Cough 2-3 weeks. CALL BACK IF: * Fever lasts over 3 days * You become short of breath * Runny nose lasts over 10 days * You become worse. CARE ADVICE given per Colds (Adult) guideline. After Care Instructions Given Call Event Type User Date / Time Description

## 2014-05-07 ENCOUNTER — Ambulatory Visit (INDEPENDENT_AMBULATORY_CARE_PROVIDER_SITE_OTHER): Payer: Managed Care, Other (non HMO) | Admitting: Obstetrics

## 2014-05-07 VITALS — BP 106/71 | HR 84 | Wt 115.0 lb

## 2014-05-07 DIAGNOSIS — M62830 Muscle spasm of back: Secondary | ICD-10-CM

## 2014-05-07 DIAGNOSIS — G4459 Other complicated headache syndrome: Secondary | ICD-10-CM

## 2014-05-07 DIAGNOSIS — Z3482 Encounter for supervision of other normal pregnancy, second trimester: Secondary | ICD-10-CM

## 2014-05-07 LAB — POCT URINALYSIS DIPSTICK
Bilirubin, UA: NEGATIVE
Blood, UA: NEGATIVE
Glucose, UA: NEGATIVE
Ketones, UA: NEGATIVE
Leukocytes, UA: NEGATIVE
Nitrite, UA: NEGATIVE
Protein, UA: NEGATIVE
Spec Grav, UA: 1.015
Urobilinogen, UA: NEGATIVE
pH, UA: 6

## 2014-05-07 MED ORDER — CYCLOBENZAPRINE HCL 10 MG PO TABS: 10.0000 mg | ORAL_TABLET | Freq: Three times a day (TID) | ORAL | Status: DC | PRN

## 2014-05-07 MED ORDER — FOLIC ACID 1 MG PO TABS: 1.0000 mg | ORAL_TABLET | Freq: Every day | ORAL | Status: DC

## 2014-05-07 MED ORDER — BUTALBITAL-APAP-CAFFEINE 50-325-40 MG PO TABS: 2.0000 | ORAL_TABLET | Freq: Four times a day (QID) | ORAL | Status: DC | PRN

## 2014-05-08 ENCOUNTER — Encounter: Payer: Self-pay | Admitting: Obstetrics

## 2014-05-08 NOTE — Progress Notes (Signed)
  Subjective:    Debra Gibbs is a 34 y.o. female being seen today for her obstetrical visit. She is at [redacted]w[redacted]d gestation. Patient reports: no complaints.  Problem List Items Addressed This Visit    None    Visit Diagnoses    Encounter for supervision of other normal pregnancy in second trimester    -  Primary    Relevant Medications    folic acid (FOLVITE) tablet    Other Relevant Orders    POCT urinalysis dipstick (Completed)    US OB Comp + 14 Wk    Other complicated headache syndrome        Relevant Medications    butalbital-acetaminophen-caffeine (FIORICET) tablet 50-325-40 mg     cyclobenzaprine (FLEXERIL) tablet    Muscle spasm of back        Relevant Medications    cyclobenzaprine (FLEXERIL) tablet      Patient Active Problem List   Diagnosis Date Noted  . Acute frontal sinusitis 04/20/2014  . Healthcare maintenance 05/08/2013  . Dactylitis 03/04/2012  . Hemorrhoids 12/22/2011  . RHEUMATOID ARTHRITIS 11/04/2009  . NEURODERMATITIS 04/29/2007  . ANXIETY 12/24/2006    Objective:     BP 106/71 mmHg  Pulse 84  Wt 115 lb (52.164 kg)  LMP 01/11/2014 Uterine Size: Below umbilicus     Assessment:    Pregnancy @ [redacted]w[redacted]d  weeks Doing well    Plan:    Problem list reviewed and updated. Labs reviewed.  Follow up in 4 weeks. FIRST/CF mutation testing/NIPT/QUAD SCREEN/fragile X/Ashkenazi Jewish population testing/Spinal muscular atrophy discussed: declined. Role of ultrasound in pregnancy discussed; fetal survey: declined. Amniocentesis discussed: not indicated.

## 2014-05-20 ENCOUNTER — Ambulatory Visit (INDEPENDENT_AMBULATORY_CARE_PROVIDER_SITE_OTHER): Payer: Managed Care, Other (non HMO)

## 2014-05-20 ENCOUNTER — Other Ambulatory Visit: Payer: Self-pay | Admitting: Obstetrics

## 2014-05-20 DIAGNOSIS — Z3482 Encounter for supervision of other normal pregnancy, second trimester: Secondary | ICD-10-CM

## 2014-05-20 DIAGNOSIS — Z1389 Encounter for screening for other disorder: Secondary | ICD-10-CM

## 2014-05-20 DIAGNOSIS — Z36 Encounter for antenatal screening of mother: Secondary | ICD-10-CM

## 2014-05-20 LAB — US OB COMP + 14 WK

## 2014-06-04 ENCOUNTER — Ambulatory Visit (INDEPENDENT_AMBULATORY_CARE_PROVIDER_SITE_OTHER): Payer: Managed Care, Other (non HMO) | Admitting: Obstetrics

## 2014-06-04 VITALS — BP 108/69 | HR 80 | Temp 98.6°F | Wt 120.0 lb

## 2014-06-04 DIAGNOSIS — Z3482 Encounter for supervision of other normal pregnancy, second trimester: Secondary | ICD-10-CM

## 2014-06-04 DIAGNOSIS — M5432 Sciatica, left side: Secondary | ICD-10-CM

## 2014-06-04 LAB — POCT URINALYSIS DIPSTICK
Bilirubin, UA: NEGATIVE
Blood, UA: NEGATIVE
Glucose, UA: NEGATIVE
Ketones, UA: NEGATIVE
Nitrite, UA: NEGATIVE
Protein, UA: NEGATIVE
Spec Grav, UA: 1.015
Urobilinogen, UA: NEGATIVE
pH, UA: 7

## 2014-06-04 MED ORDER — OXYCODONE HCL 10 MG PO TABS: 10.0000 mg | ORAL_TABLET | Freq: Four times a day (QID) | ORAL | Status: DC | PRN

## 2014-06-05 ENCOUNTER — Encounter: Payer: Self-pay | Admitting: Obstetrics

## 2014-06-05 NOTE — Progress Notes (Signed)
Subjective:    Debra Gibbs is a 34 y.o. female being seen today for her obstetrical visit. She is at [redacted]w[redacted]d gestation. Patient reports: Recurrence of sciatica pain radiating down left leg.  Had same problem during previous pregnancy. Fetal movement: normal.  Problem List Items Addressed This Visit    None    Visit Diagnoses    Encounter for supervision of other normal pregnancy in second trimester    -  Primary    Relevant Medications    Oxycodone HCl 10 MG TABS    Other Relevant Orders    POCT urinalysis dipstick (Completed)    Sciatica associated with disorder of lumbar spine, left        Relevant Medications    Oxycodone HCl 10 MG TABS      Patient Active Problem List   Diagnosis Date Noted  . Acute frontal sinusitis 04/20/2014  . Healthcare maintenance 05/08/2013  . Dactylitis 03/04/2012  . Hemorrhoids 12/22/2011  . RHEUMATOID ARTHRITIS 11/04/2009  . NEURODERMATITIS 04/29/2007  . ANXIETY 12/24/2006   Objective:    BP 108/69 mmHg  Pulse 80  Temp(Src) 98.6 F (37 C)  Wt 120 lb (54.432 kg)  LMP 01/11/2014 FHT: 160 BPM  Uterine Size: size equals dates     Assessment:    Pregnancy @ [redacted]w[redacted]d     Sciatica - Left  Plan:   Needs referral for pain management.   OBGCT: ordered for next visit.  Labs, problem list reviewed and updated 2 hr GTT planned Follow up in 4 weeks.

## 2014-06-08 ENCOUNTER — Telehealth: Payer: Self-pay | Admitting: *Deleted

## 2014-06-08 ENCOUNTER — Inpatient Hospital Stay (HOSPITAL_COMMUNITY)
Admission: AD | Admit: 2014-06-08 | Discharge: 2014-06-08 | Disposition: A | Discharge status: Home / Self Care | Payer: Managed Care, Other (non HMO) | Source: Ambulatory Visit | Attending: Obstetrics | Admitting: Obstetrics

## 2014-06-08 ENCOUNTER — Encounter (HOSPITAL_COMMUNITY): Payer: Self-pay | Admitting: *Deleted

## 2014-06-08 DIAGNOSIS — O9989 Other specified diseases and conditions complicating pregnancy, childbirth and the puerperium: Secondary | ICD-10-CM | POA: Diagnosis not present

## 2014-06-08 DIAGNOSIS — Z87891 Personal history of nicotine dependence: Secondary | ICD-10-CM | POA: Diagnosis not present

## 2014-06-08 DIAGNOSIS — R51 Headache: Secondary | ICD-10-CM | POA: Diagnosis present

## 2014-06-08 DIAGNOSIS — Z3A21 21 weeks gestation of pregnancy: Secondary | ICD-10-CM

## 2014-06-08 DIAGNOSIS — R519 Headache, unspecified: Secondary | ICD-10-CM

## 2014-06-08 DIAGNOSIS — O26892 Other specified pregnancy related conditions, second trimester: Secondary | ICD-10-CM

## 2014-06-08 HISTORY — DX: Headache, unspecified: R51.9

## 2014-06-08 HISTORY — DX: Headache: R51

## 2014-06-08 LAB — URINALYSIS, ROUTINE W REFLEX MICROSCOPIC
Bilirubin Urine: NEGATIVE
Glucose, UA: NEGATIVE mg/dL
Hgb urine dipstick: NEGATIVE
Ketones, ur: NEGATIVE mg/dL
Leukocytes, UA: NEGATIVE
Nitrite: NEGATIVE
Protein, ur: NEGATIVE mg/dL
Specific Gravity, Urine: 1.015 (ref 1.005–1.030)
Urobilinogen, UA: 0.2 mg/dL (ref 0.0–1.0)
pH: 6 (ref 5.0–8.0)

## 2014-06-08 LAB — LIPID PANEL
HDL: 72 mg/dL — AB (ref 35–70)
LDL Cholesterol: 235 mg/dL

## 2014-06-08 MED ORDER — DEXAMETHASONE SODIUM PHOSPHATE 10 MG/ML IJ SOLN
10.0000 mg | Freq: Once | INTRAMUSCULAR | Status: AC
  Administered 2014-06-08: 10 mg via INTRAVENOUS
  Filled 2014-06-08: qty 1

## 2014-06-08 MED ORDER — BUTALBITAL-APAP-CAFFEINE 50-325-40 MG PO TABS: 1.0000 | ORAL_TABLET | Freq: Four times a day (QID) | ORAL | Status: DC | PRN

## 2014-06-08 MED ORDER — PROCHLORPERAZINE EDISYLATE 5 MG/ML IJ SOLN
10.0000 mg | Freq: Four times a day (QID) | INTRAMUSCULAR | Status: DC | PRN
  Administered 2014-06-08: 10 mg via INTRAVENOUS
  Filled 2014-06-08 (×2): qty 2

## 2014-06-08 MED ORDER — SODIUM CHLORIDE 0.9 % IV BOLUS (SEPSIS)
1000.0000 mL | Freq: Once | INTRAVENOUS | Status: AC
  Administered 2014-06-08: 1000 mL via INTRAVENOUS

## 2014-06-08 MED ORDER — DIPHENHYDRAMINE HCL 50 MG/ML IJ SOLN
25.0000 mg | Freq: Once | INTRAMUSCULAR | Status: AC
  Administered 2014-06-08: 25 mg via INTRAVENOUS
  Filled 2014-06-08: qty 1

## 2014-06-08 NOTE — Telephone Encounter (Signed)
[facility]- her BP was 100/50 and they were concerned. Her cholesterol is high- but the reading was even more elevated at 341. Patient also states she has headache. Advised patient  In review of her overall BP during her pregnancy- she is within her normal range. The cholesterol will have to wait until after pregnancy for any kind of treatment, but avoid estrogen until it is treated. As far as the headache- the patient reports she does have allergies and the provider has given her Fioricet. Patient to start her normal allergy treatment, use Fioricet at the start if headache and if treatment does not help- to go to MAU for headache cocktail.

## 2014-06-08 NOTE — MAU Provider Note (Signed)
History     CSN: [id number]  Arrival date and time: 06/08/14 1451   First Provider Initiated Contact with Patient 06/08/14 1540      No chief complaint on file.  HPI  Debra Gibbs is a 34 y.o. a [id number] at [redacted]w[redacted]d who presents today with a headache. She states that she has had this headache since 0200 on Saturday morning. She has tried fioricet, and it will decrease the pain, but not take the pain fully away. She denies any complications with this pregnancy. She denies VB, LOF. She confirms fetal movement.   Past Medical History  Diagnosis Date  . Anxiety   . Rheumatoid arthritis(714.0)   . Headache     with pregnancy only    Past Surgical History  Procedure Laterality Date  . Wisdom tooth extraction      Family History  Problem Relation Age of Onset  . Arthritis Father     History  Substance Use Topics  . Smoking status: Former Smoker    Quit date: 04/10/2005  . Smokeless tobacco: Never Used  . Alcohol Use: No    Allergies:  Allergies  Allergen Reactions  . Citalopram Hydrobromide     REACTION: shakes and made her jaw tight    Prescriptions prior to admission  Medication Sig Dispense Refill Last Dose  . butalbital-acetaminophen-caffeine (FIORICET) 50-325-40 MG per tablet Take 2 tablets by mouth every 6 (six) hours as needed for headache. 40 tablet 2 Taking  . folic acid (FOLVITE) 1 MG tablet Take 1 tablet (1 mg total) by mouth daily. 90 tablet 3 Taking  . Oxycodone HCl 10 MG TABS Take 1 tablet (10 mg total) by mouth every 6 (six) hours as needed. 40 tablet 0   . Prenatal Vit-Fe Fumarate-FA (PRENATAL VITAMINS) 28-0.8 MG TABS Take 1 tablet by mouth daily.   Taking    ROS Physical Exam   Blood pressure 115/75, pulse 95, resp. rate 16, height 5\' 2"  (1.575 m), weight 54.432 kg (120 lb), last menstrual period 01/11/2014.  Physical Exam  Nursing note and vitals reviewed. Constitutional: She is oriented to person, place, and time. She appears well-developed and  well-nourished. No distress.  Cardiovascular: Normal rate.   Respiratory: Effort normal.  GI: Soft. There is no tenderness. There is no rebound.  Neurological: She is alert and oriented to person, place, and time.  Skin: Skin is warm and dry.  Psychiatric: She has a normal mood and affect.    MAU Course  Procedures  Results for orders placed or performed during the hospital encounter of 06/08/14 (from the past 24 hour(s))  Urinalysis, Routine w reflex microscopic     Status: None   Collection Time: 06/08/14  3:00 PM  Result Value Ref Range   Color, Urine YELLOW YELLOW   APPearance CLEAR CLEAR   Specific Gravity, Urine 1.015 1.005 - 1.030   pH 6.0 5.0 - 8.0   Glucose, UA NEGATIVE NEGATIVE mg/dL   Hgb urine dipstick NEGATIVE NEGATIVE   Bilirubin Urine NEGATIVE NEGATIVE   Ketones, ur NEGATIVE NEGATIVE mg/dL   Protein, ur NEGATIVE NEGATIVE mg/dL   Urobilinogen, UA 0.2 0.0 - 1.0 mg/dL   Nitrite NEGATIVE NEGATIVE   Leukocytes, UA NEGATIVE NEGATIVE    1650: Patient states headache has improved. Rates her pain 0/10.  Assessment and Plan   1. Headache in pregnancy, antepartum, second trimester    DC home Second trimester precautions reviewed Return to MAU as needed  Follow-up Information    Follow up  with Shelly Bombard, MD.   Specialty:  Obstetrics and Gynecology   Why:  As needed   Contact information:   [address] [place] [address] [phone]        Medication List    TAKE these medications        butalbital-acetaminophen-caffeine 50-325-40 MG per tablet  Commonly known as:  FIORICET  Take 2 tablets by mouth every 6 (six) hours as needed for headache.     butalbital-acetaminophen-caffeine 50-325-40 MG per tablet  Commonly known as:  FIORICET  Take 1-2 tablets by mouth every 6 (six) hours as needed for headache.     folic acid 1 MG tablet  Commonly known as:  FOLVITE  Take 1 tablet (1 mg total) by mouth daily.     Oxycodone HCl  10 MG Tabs  Take 1 tablet (10 mg total) by mouth every 6 (six) hours as needed.     Prenatal Vitamins 28-0.8 MG Tabs  Take 1 tablet by mouth daily.         Debra Gibbs 06/08/2014, 3:53 PM

## 2014-06-08 NOTE — Discharge Instructions (Signed)
Second Trimester of Pregnancy The second trimester is from week 13 through week 28, months 4 through 6. The second trimester is often a time when you feel your best. Your body has also adjusted to being pregnant, and you begin to feel better physically. Usually, morning sickness has lessened or quit completely, you may have more energy, and you may have an increase in appetite. The second trimester is also a time when the fetus is growing rapidly. At the end of the sixth month, the fetus is about 9 inches long and weighs about 1 pounds. You will likely begin to feel the baby move (quickening) between 18 and 20 weeks of the pregnancy. BODY CHANGES Your body goes through many changes during pregnancy. The changes vary from woman to woman.   Your weight will continue to increase. You will notice your lower abdomen bulging out.  You may begin to get stretch marks on your hips, abdomen, and breasts.  You may develop headaches that can be relieved by medicines approved by your health care provider.  You may urinate more often because the fetus is pressing on your bladder.  You may develop or continue to have heartburn as a result of your pregnancy.  You may develop constipation because certain hormones are causing the muscles that push waste through your intestines to slow down.  You may develop hemorrhoids or swollen, bulging veins (varicose veins).  You may have back pain because of the weight gain and pregnancy hormones relaxing your joints between the bones in your pelvis and as a result of a shift in weight and the muscles that support your balance.  Your breasts will continue to grow and be tender.  Your gums may bleed and may be sensitive to brushing and flossing.  Dark spots or blotches (chloasma, mask of pregnancy) may develop on your face. This will likely fade after the baby is born.  A dark line from your belly button to the pubic area (linea nigra) may appear. This will likely fade  after the baby is born.  You may have changes in your hair. These can include thickening of your hair, rapid growth, and changes in texture. Some women also have hair loss during or after pregnancy, or hair that feels dry or thin. Your hair will most likely return to normal after your baby is born. WHAT TO EXPECT AT YOUR PRENATAL VISITS During a routine prenatal visit:  You will be weighed to make sure you and the fetus are growing normally.  Your blood pressure will be taken.  Your abdomen will be measured to track your baby's growth.  The fetal heartbeat will be listened to.  Any test results from the previous visit will be discussed. Your health care provider may ask you:  How you are feeling.  If you are feeling the baby move.  If you have had any abnormal symptoms, such as leaking fluid, bleeding, severe headaches, or abdominal cramping.  If you have any questions. Other tests that may be performed during your second trimester include:  Blood tests that check for:  Low iron levels (anemia).  Gestational diabetes (between 24 and 28 weeks).  Rh antibodies.  Urine tests to check for infections, diabetes, or protein in the urine.  An ultrasound to confirm the proper growth and development of the baby.  An amniocentesis to check for possible genetic problems.  Fetal screens for spina bifida and Down syndrome. HOME CARE INSTRUCTIONS   Avoid all smoking, herbs, alcohol, and unprescribed   drugs. These chemicals affect the formation and growth of the baby.  Follow your health care provider's instructions regarding medicine use. There are medicines that are either safe or unsafe to take during pregnancy.  Exercise only as directed by your health care provider. Experiencing uterine cramps is a good sign to stop exercising.  Continue to eat regular, healthy meals.  Wear a good support bra for breast tenderness.  Do not use hot tubs, steam rooms, or saunas.  Wear your  seat belt at all times when driving.  Avoid raw meat, uncooked cheese, cat litter boxes, and soil used by cats. These carry germs that can cause birth defects in the baby.  Take your prenatal vitamins.  Try taking a stool softener (if your health care provider approves) if you develop constipation. Eat more high-fiber foods, such as fresh vegetables or fruit and whole grains. Drink plenty of fluids to keep your urine clear or pale yellow.  Take warm sitz baths to soothe any pain or discomfort caused by hemorrhoids. Use hemorrhoid cream if your health care provider approves.  If you develop varicose veins, wear support hose. Elevate your feet for 15 minutes, 3-4 times a day. Limit salt in your diet.  Avoid heavy lifting, wear low heel shoes, and practice good posture.  Rest with your legs elevated if you have leg cramps or low back pain.  Visit your dentist if you have not gone yet during your pregnancy. Use a soft toothbrush to brush your teeth and be gentle when you floss.  A sexual relationship may be continued unless your health care provider directs you otherwise.  Continue to go to all your prenatal visits as directed by your health care provider. SEEK MEDICAL CARE IF:   You have dizziness.  You have mild pelvic cramps, pelvic pressure, or nagging pain in the abdominal area.  You have persistent nausea, vomiting, or diarrhea.  You have a bad smelling vaginal discharge.  You have pain with urination. SEEK IMMEDIATE MEDICAL CARE IF:   You have a fever.  You are leaking fluid from your vagina.  You have spotting or bleeding from your vagina.  You have severe abdominal cramping or pain.  You have rapid weight gain or loss.  You have shortness of breath with chest pain.  You notice sudden or extreme swelling of your face, hands, ankles, feet, or legs.  You have not felt your baby move in over an hour.  You have severe headaches that do not go away with  medicine.  You have vision changes. Document Released: 03/21/2001 Document Revised: 04/01/2013 Document Reviewed: 05/28/2012 Exit[employer] Patient Information 2015 [employer]. This information is not intended to replace advice given to you by your health care provider. Make sure you discuss any questions you have with your health care provider.  

## 2014-06-08 NOTE — MAU Note (Signed)
Urine in lab 

## 2014-06-08 NOTE — MAU Note (Signed)
Pt c/o headache since Saturday, using fioricet which lowers pain to a managable level but headache still present constantly. Was advised to come here by Dr.Harpers nurse.

## 2014-06-10 ENCOUNTER — Encounter: Payer: Self-pay | Admitting: Internal Medicine

## 2014-06-12 ENCOUNTER — Telehealth: Payer: Self-pay

## 2014-06-12 NOTE — Telephone Encounter (Signed)
Left message with patient that we were working on her referral for pain manangement - should hear following week - they are reviewing her medical information at this time.

## 2014-06-15 ENCOUNTER — Telehealth: Payer: Self-pay | Admitting: *Deleted

## 2014-06-15 NOTE — Telephone Encounter (Addendum)
Patient called with constipation issues. 10:10 Call to patient -she has been to the bathroom this morning- but feels very uncomfortable post BM. Patient state she is not concerned as far as her pregnancy is concerned- she has good movement and no contractions.Discussed constipation and treatment- recommend warm soaks and OTC treatment of hemorrhoids, use of stool softener, increase fluids and diet helps. Patient to try these things today and if she feels she is not improving with the post BM symptoms- she will call for an appointment tomorrow.

## 2014-06-16 ENCOUNTER — Telehealth: Payer: Self-pay | Admitting: *Deleted

## 2014-06-16 NOTE — Telephone Encounter (Signed)
Patient is requesting a refill of her pain medication- last filled 06/04/2014. 2:44 Call to patient- will have provider review request- LM on VM .

## 2014-06-17 NOTE — Telephone Encounter (Signed)
Request denied.

## 2014-06-18 ENCOUNTER — Telehealth: Payer: Self-pay | Admitting: *Deleted

## 2014-06-18 NOTE — Telephone Encounter (Signed)
Patient is calling to check on her refill request. 12:00 Call to patient- advised that provider has denied her pain medication refill. Patient inquired about pain management referral. Will check with provider and let her know.

## 2014-06-19 ENCOUNTER — Other Ambulatory Visit: Payer: Self-pay | Admitting: Obstetrics

## 2014-06-19 DIAGNOSIS — M5430 Sciatica, unspecified side: Secondary | ICD-10-CM

## 2014-06-19 MED ORDER — TRAMADOL HCL 50 MG PO TABS: 50.0000 mg | ORAL_TABLET | Freq: Four times a day (QID) | ORAL | Status: DC | PRN

## 2014-06-22 NOTE — Telephone Encounter (Signed)
Provider has spoken with patient

## 2014-06-23 ENCOUNTER — Other Ambulatory Visit: Payer: Self-pay | Admitting: Obstetrics

## 2014-06-23 ENCOUNTER — Telehealth: Payer: Self-pay

## 2014-06-23 DIAGNOSIS — Z3482 Encounter for supervision of other normal pregnancy, second trimester: Secondary | ICD-10-CM

## 2014-06-23 DIAGNOSIS — M5432 Sciatica, left side: Secondary | ICD-10-CM

## 2014-06-23 MED ORDER — OXYCODONE HCL 10 MG PO TABS: 10.0000 mg | ORAL_TABLET | Freq: Four times a day (QID) | ORAL | Status: DC | PRN

## 2014-06-23 NOTE — Telephone Encounter (Signed)
PATIENT HAS APPT WITH [facility] 06/25/14 AT 9:15AM - LEFT MESSAGE ON HER VM - SHE ALSO CALLED ABOUT HAVING SHAKES FROM HER MEDICATION

## 2014-06-23 NOTE — Telephone Encounter (Signed)
spoke with pain management clinic manager, Jarrett Soho several times concerning this patient - they are trying to get her in - should know soemthing today - this clinic has a 3-4 week backlog - [facility] would not see her -

## 2014-06-24 ENCOUNTER — Telehealth: Payer: Self-pay

## 2014-06-24 NOTE — Telephone Encounter (Signed)
patient has FMLA paperwork to pick up - 15.00 charge

## 2014-07-02 ENCOUNTER — Other Ambulatory Visit: Payer: Managed Care, Other (non HMO)

## 2014-07-02 ENCOUNTER — Encounter: Payer: Managed Care, Other (non HMO) | Admitting: Obstetrics

## 2014-07-03 ENCOUNTER — Other Ambulatory Visit: Payer: Managed Care, Other (non HMO)

## 2014-07-03 ENCOUNTER — Ambulatory Visit (INDEPENDENT_AMBULATORY_CARE_PROVIDER_SITE_OTHER): Payer: Managed Care, Other (non HMO) | Admitting: Certified Nurse Midwife

## 2014-07-03 VITALS — BP 99/68 | HR 85 | Temp 98.0°F | Wt 124.0 lb

## 2014-07-03 DIAGNOSIS — Z029 Encounter for administrative examinations, unspecified: Secondary | ICD-10-CM

## 2014-07-03 DIAGNOSIS — Z3482 Encounter for supervision of other normal pregnancy, second trimester: Secondary | ICD-10-CM | POA: Diagnosis not present

## 2014-07-03 LAB — CBC
HCT: 32.5 % — ABNORMAL LOW (ref 36.0–46.0)
Hemoglobin: 11.1 g/dL — ABNORMAL LOW (ref 12.0–15.0)
MCH: 31.7 pg (ref 26.0–34.0)
MCHC: 34.2 g/dL (ref 30.0–36.0)
MCV: 92.9 fL (ref 78.0–100.0)
MPV: 9.9 fL (ref 8.6–12.4)
Platelets: 236 10*3/uL (ref 150–400)
RBC: 3.5 MIL/uL — ABNORMAL LOW (ref 3.87–5.11)
RDW: 13.3 % (ref 11.5–15.5)
WBC: 7.7 10*3/uL (ref 4.0–10.5)

## 2014-07-03 LAB — POCT URINALYSIS DIPSTICK
Bilirubin, UA: NEGATIVE
Blood, UA: NEGATIVE
Glucose, UA: NEGATIVE
Ketones, UA: NEGATIVE
Nitrite, UA: NEGATIVE
Protein, UA: NEGATIVE
Spec Grav, UA: <=1.005
Urobilinogen, UA: NEGATIVE
pH, UA: 8

## 2014-07-03 NOTE — Addendum Note (Signed)
Addended by: Carole Binning on: 07/03/2014 10:05 AM   Modules accepted: Orders

## 2014-07-03 NOTE — Progress Notes (Signed)
Subjective:    Debra Gibbs is a 34 y.o. female being seen today for her obstetrical visit. She is at [redacted]w[redacted]d gestation. She has had sciatica, also occurred with last pregnancy.  Has maternity abdominal support belt, wear occasionally.  Desires BTL postpartum.  Has seen orthopedics for the sciatica no change in treatment plan.  Has been doing sitz baths and frequent change of position that is helping with the sciatic pain.  Patient reports: backache, no bleeding, no contractions, no cramping and no leaking . Fetal movement: normal.  Problem List Items Addressed This Visit    None    Visit Diagnoses    Encounter for supervision of other normal pregnancy in second trimester    -  Primary    Relevant Orders    US OB Limited      Patient Active Problem List   Diagnosis Date Noted  . Acute frontal sinusitis 04/20/2014  . Healthcare maintenance 05/08/2013  . Dactylitis 03/04/2012  . Hemorrhoids 12/22/2011  . RHEUMATOID ARTHRITIS 11/04/2009  . NEURODERMATITIS 04/29/2007  . ANXIETY 12/24/2006   Objective:    BP 99/68 mmHg  Pulse 85  Temp(Src) 98 F (36.7 C)  Wt 56.246 kg (124 lb)  LMP 01/11/2014 FHT: 130 BPM  Uterine Size: 24 cm and size equals dates     Assessment:  Sciatica Doing Well  Pregnancy @ [redacted]w[redacted]d    Plan:    OBGCT: completed at today's visit. Signs and symptoms of preterm labor: discussed. BTL Paperwork completed.  F/U anatomy ultrasound ordered for sub-optimal views of fetal abdomen/spine Labs, problem list reviewed and updated Follow up in 4 weeks.

## 2014-07-04 LAB — GLUCOSE TOLERANCE, 2 HOURS W/ 1HR
Glucose, 1 hour: 140 mg/dL (ref 70–170)
Glucose, 2 hour: 124 mg/dL (ref 70–139)
Glucose, Fasting: 67 mg/dL — ABNORMAL LOW (ref 70–99)

## 2014-07-04 LAB — RPR

## 2014-07-04 LAB — HIV ANTIBODY (ROUTINE TESTING W REFLEX): HIV 1&2 Ab, 4th Generation: NONREACTIVE

## 2014-07-07 ENCOUNTER — Telehealth: Payer: Self-pay | Admitting: *Deleted

## 2014-07-07 ENCOUNTER — Telehealth: Payer: Self-pay | Admitting: Obstetrics

## 2014-07-07 ENCOUNTER — Other Ambulatory Visit: Payer: Self-pay | Admitting: Obstetrics

## 2014-07-07 DIAGNOSIS — Z3482 Encounter for supervision of other normal pregnancy, second trimester: Secondary | ICD-10-CM

## 2014-07-07 DIAGNOSIS — M5432 Sciatica, left side: Secondary | ICD-10-CM

## 2014-07-07 MED ORDER — OXYCODONE HCL 10 MG PO TABS: 10.0000 mg | ORAL_TABLET | Freq: Four times a day (QID) | ORAL | Status: DC | PRN

## 2014-07-07 NOTE — Telephone Encounter (Signed)
Patient called for lab results and also patient needs refill on her pain medication. 9:55 LM on VM- labs are good- glucose actually a little low-so make sure she eats ans hgb little low eat good diet. Will let provider know she needs RF and call her for pick up when ready. Rx printed by Dr Jodi Mourning and Hassan Rowan to call patient.

## 2014-07-08 ENCOUNTER — Other Ambulatory Visit: Payer: Self-pay | Admitting: Certified Nurse Midwife

## 2014-07-08 ENCOUNTER — Other Ambulatory Visit: Payer: Managed Care, Other (non HMO)

## 2014-07-08 DIAGNOSIS — Z3689 Encounter for other specified antenatal screening: Secondary | ICD-10-CM

## 2014-07-08 NOTE — Telephone Encounter (Signed)
[id number] - RX picked up by patient today. brm

## 2014-07-15 ENCOUNTER — Ambulatory Visit (INDEPENDENT_AMBULATORY_CARE_PROVIDER_SITE_OTHER): Payer: Managed Care, Other (non HMO)

## 2014-07-15 DIAGNOSIS — Z36 Encounter for antenatal screening of mother: Secondary | ICD-10-CM

## 2014-07-15 DIAGNOSIS — Z3689 Encounter for other specified antenatal screening: Secondary | ICD-10-CM

## 2014-07-15 LAB — US OB FOLLOW UP

## 2014-07-20 ENCOUNTER — Other Ambulatory Visit: Payer: Self-pay | Admitting: Obstetrics

## 2014-07-20 ENCOUNTER — Telehealth: Payer: Self-pay | Admitting: Obstetrics

## 2014-07-20 ENCOUNTER — Telehealth: Payer: Self-pay

## 2014-07-20 DIAGNOSIS — Z3482 Encounter for supervision of other normal pregnancy, second trimester: Secondary | ICD-10-CM

## 2014-07-20 DIAGNOSIS — M5432 Sciatica, left side: Secondary | ICD-10-CM

## 2014-07-20 MED ORDER — OXYCODONE HCL 10 MG PO TABS: 10.0000 mg | ORAL_TABLET | Freq: Four times a day (QID) | ORAL | Status: DC | PRN

## 2014-07-20 NOTE — Telephone Encounter (Signed)
called patient with appt date and time for Raliegh Ip appt with Dr. Almedia Balls on 4//14/16 at 8:30am

## 2014-07-22 NOTE — Telephone Encounter (Signed)
07/22/2014 - Patient picked up RX. brm

## 2014-07-28 ENCOUNTER — Telehealth: Payer: Self-pay | Admitting: *Deleted

## 2014-07-28 NOTE — Telephone Encounter (Signed)
Patient states her FMLA has been approved- but anything having to do with her sciatica - nerve pain needs a letter.  4/19 10:19 Attempt to call patient - LM on VM to CB.

## 2014-07-29 NOTE — Telephone Encounter (Signed)
10:21 Patient called back- regarding letter. She is also complaining of pain in ribs that makes it hurt to sit and stand. 2:00 LM on VM- patient may need intermittent leave paperwork for her back pain or she may need to come out depending on her symptoms.

## 2014-07-30 ENCOUNTER — Encounter: Payer: Self-pay | Admitting: *Deleted

## 2014-07-30 ENCOUNTER — Ambulatory Visit (INDEPENDENT_AMBULATORY_CARE_PROVIDER_SITE_OTHER): Payer: Managed Care, Other (non HMO) | Admitting: Certified Nurse Midwife

## 2014-07-30 VITALS — BP 102/70 | HR 87 | Temp 98.2°F | Wt 128.0 lb

## 2014-07-30 DIAGNOSIS — Z3482 Encounter for supervision of other normal pregnancy, second trimester: Secondary | ICD-10-CM

## 2014-07-30 NOTE — Progress Notes (Signed)
Subjective:    Debra Gibbs is a 34 y.o. female being seen today for her obstetrical visit. She is at [redacted]w[redacted]d gestation. Patient reports no bleeding, no contractions, no cramping, no leaking and LUQ pain around the ribs. Fetal movement: normal.  Problem List Items Addressed This Visit    None    Visit Diagnoses    Encounter for supervision of other normal pregnancy in second trimester    -  Primary    Relevant Orders    POCT urinalysis dipstick      Patient Active Problem List   Diagnosis Date Noted  . Acute frontal sinusitis 04/20/2014  . Healthcare maintenance 05/08/2013  . Dactylitis 03/04/2012  . Hemorrhoids 12/22/2011  . RHEUMATOID ARTHRITIS 11/04/2009  . NEURODERMATITIS 04/29/2007  . ANXIETY 12/24/2006   Objective:    BP 102/70 mmHg  Pulse 87  Temp(Src) 98.2 F (36.8 C)  Wt 58.06 kg (128 lb)  LMP 01/11/2014 FHT:  140's BPM  Uterine Size: size equals dates  Presentation: cephalic   LUQ pain associated with fetal position, radiating to patients spine, increased pain with movement.    Assessment:    Pregnancy @ [redacted]w[redacted]d weeks   LUQ pain associated with fetal position and work load: modified work assignment  Plan:    Letter for modified work assignment of no lifting, standing.  Limited to 4 hours of work per day.     labs reviewed, problem list updated Pediatrician: discussed. Infant feeding: plans to breastfeed. Maternity leave: discussed, forms done, discussed modified work load to 4 hours/day due to abdominal pain during pregnancy. Cigarette smoking: never smoked. Heating pad to affected area of pain, sitz baths.    Orders Placed This Encounter  Procedures  . POCT urinalysis dipstick   No orders of the defined types were placed in this encounter.   Follow up in 2 Weeks.

## 2014-08-03 ENCOUNTER — Telehealth: Payer: Self-pay | Admitting: *Deleted

## 2014-08-03 NOTE — Telephone Encounter (Signed)
Patient is calling for refill of her pain medication.

## 2014-08-03 NOTE — Telephone Encounter (Signed)
Patient was seen in the office- letter written to decrease hours.

## 2014-08-04 ENCOUNTER — Encounter: Payer: Managed Care, Other (non HMO) | Admitting: Certified Nurse Midwife

## 2014-08-04 ENCOUNTER — Encounter: Payer: Self-pay | Admitting: Obstetrics

## 2014-08-04 ENCOUNTER — Encounter: Payer: Self-pay | Admitting: *Deleted

## 2014-08-04 ENCOUNTER — Ambulatory Visit (INDEPENDENT_AMBULATORY_CARE_PROVIDER_SITE_OTHER): Payer: Managed Care, Other (non HMO) | Admitting: Obstetrics

## 2014-08-04 VITALS — BP 100/68 | HR 90 | Wt 131.0 lb

## 2014-08-04 DIAGNOSIS — Z3482 Encounter for supervision of other normal pregnancy, second trimester: Secondary | ICD-10-CM

## 2014-08-04 DIAGNOSIS — Z3483 Encounter for supervision of other normal pregnancy, third trimester: Secondary | ICD-10-CM | POA: Diagnosis not present

## 2014-08-04 DIAGNOSIS — M5432 Sciatica, left side: Secondary | ICD-10-CM

## 2014-08-04 LAB — POCT URINALYSIS DIPSTICK
Bilirubin, UA: NEGATIVE
Blood, UA: NEGATIVE
Glucose, UA: NEGATIVE
Nitrite, UA: NEGATIVE
Spec Grav, UA: 1.015
Urobilinogen, UA: NEGATIVE
pH, UA: 6.5

## 2014-08-04 MED ORDER — OXYCODONE HCL 10 MG PO TABS: 10.0000 mg | ORAL_TABLET | Freq: Four times a day (QID) | ORAL | Status: DC | PRN

## 2014-08-04 MED ORDER — TRAMADOL HCL 50 MG PO TABS: 50.0000 mg | ORAL_TABLET | Freq: Four times a day (QID) | ORAL | Status: DC | PRN

## 2014-08-04 NOTE — Progress Notes (Signed)
Subjective:    Debra Gibbs is a 34 y.o. female being seen today for her obstetrical visit. She is at [redacted]w[redacted]d gestation. Patient reports backache. Fetal movement: normal.  Problem List Items Addressed This Visit    None    Visit Diagnoses    Encounter for supervision of other normal pregnancy in third trimester    -  Primary    Relevant Orders    POCT urinalysis dipstick (Completed)    Encounter for supervision of other normal pregnancy in second trimester        Relevant Medications    Oxycodone HCl 10 MG TABS    Sciatica associated with disorder of lumbar spine, left        Relevant Medications    Oxycodone HCl 10 MG TABS    traMADol (ULTRAM) 50 MG tablet      Patient Active Problem List   Diagnosis Date Noted  . Acute frontal sinusitis 04/20/2014  . Healthcare maintenance 05/08/2013  . Dactylitis 03/04/2012  . Hemorrhoids 12/22/2011  . RHEUMATOID ARTHRITIS 11/04/2009  . NEURODERMATITIS 04/29/2007  . ANXIETY 12/24/2006   Objective:    BP 100/68 mmHg  Pulse 90  Wt 131 lb (59.421 kg)  LMP 01/11/2014 FHT:  160 BPM  Uterine Size: size equals dates  Presentation: unsure     Assessment:    Pregnancy @ [redacted]w[redacted]d weeks   Plan:     labs reviewed, problem list updated Consent signed. GBS sent TDAP offered  Rhogam given for RH negative Pediatrician: discussed. Infant feeding: plans to breastfeed. Maternity leave: discussed. Cigarette smoking: quit 2007. Orders Placed This Encounter  Procedures  . POCT urinalysis dipstick   Meds ordered this encounter  Medications  . Oxycodone HCl 10 MG TABS    Sig: Take 1 tablet (10 mg total) by mouth every 6 (six) hours as needed.    Dispense:  40 tablet    Refill:  0  . traMADol (ULTRAM) 50 MG tablet    Sig: Take 1-2 tablets (50-100 mg total) by mouth every 6 (six) hours as needed for moderate pain or severe pain.    Dispense:  40 tablet    Refill:  2   Follow up in 2 Weeks.

## 2014-08-06 ENCOUNTER — Telehealth: Payer: Self-pay

## 2014-08-06 NOTE — Telephone Encounter (Signed)
patient's FMLA papers are ready - she can pick-up - 15.00 collect - patient was told about that

## 2014-08-10 DIAGNOSIS — Z029 Encounter for administrative examinations, unspecified: Secondary | ICD-10-CM

## 2014-08-14 ENCOUNTER — Encounter: Payer: Self-pay | Admitting: Certified Nurse Midwife

## 2014-08-17 ENCOUNTER — Encounter: Payer: Self-pay | Admitting: Obstetrics

## 2014-08-17 ENCOUNTER — Ambulatory Visit (INDEPENDENT_AMBULATORY_CARE_PROVIDER_SITE_OTHER): Payer: Managed Care, Other (non HMO) | Admitting: Obstetrics

## 2014-08-17 VITALS — BP 101/63 | HR 83 | Temp 98.3°F | Wt 132.0 lb

## 2014-08-17 DIAGNOSIS — Z3483 Encounter for supervision of other normal pregnancy, third trimester: Secondary | ICD-10-CM

## 2014-08-17 DIAGNOSIS — Z331 Pregnant state, incidental: Secondary | ICD-10-CM

## 2014-08-17 DIAGNOSIS — Z3A31 31 weeks gestation of pregnancy: Secondary | ICD-10-CM

## 2014-08-17 DIAGNOSIS — Z1389 Encounter for screening for other disorder: Secondary | ICD-10-CM

## 2014-08-17 LAB — POCT URINALYSIS DIPSTICK
Bilirubin, UA: NEGATIVE
Blood, UA: NEGATIVE
Glucose, UA: NEGATIVE
Leukocytes, UA: NEGATIVE
Nitrite, UA: NEGATIVE
Protein, UA: NEGATIVE
Spec Grav, UA: <=1.005
Urobilinogen, UA: NEGATIVE
pH, UA: 7

## 2014-08-17 NOTE — Progress Notes (Signed)
Subjective:    Debra Gibbs is a 34 y.o. female being seen today for her obstetrical visit. She is at [redacted]w[redacted]d gestation. Patient reports backache. Fetal movement: normal.  Problem List Items Addressed This Visit    None    Visit Diagnoses    Encounter for supervision of other normal pregnancy in third trimester    -  Primary    Relevant Orders    POCT urinalysis dipstick (Completed)      Patient Active Problem List   Diagnosis Date Noted  . Acute frontal sinusitis 04/20/2014  . Healthcare maintenance 05/08/2013  . Dactylitis 03/04/2012  . Hemorrhoids 12/22/2011  . RHEUMATOID ARTHRITIS 11/04/2009  . NEURODERMATITIS 04/29/2007  . ANXIETY 12/24/2006   Objective:    BP 101/63 mmHg  Pulse 83  Temp(Src) 98.3 F (36.8 C)  Wt 132 lb (59.875 kg)  LMP 01/11/2014 FHT:  160 BPM  Uterine Size: size equals dates  Presentation: unsure     Assessment:    Pregnancy @ [redacted]w[redacted]d weeks   Plan:     labs reviewed, problem list updated Consent signed. GBS sent TDAP offered  Rhogam given for RH negative Pediatrician: discussed. Infant feeding: plans to breastfeed. Maternity leave: discussed. Cigarette smoking: quit 2007. Orders Placed This Encounter  Procedures  . POCT urinalysis dipstick   No orders of the defined types were placed in this encounter.   Follow up in 2 Weeks.

## 2014-08-18 ENCOUNTER — Other Ambulatory Visit: Payer: Self-pay | Admitting: Obstetrics

## 2014-08-18 DIAGNOSIS — Z3482 Encounter for supervision of other normal pregnancy, second trimester: Secondary | ICD-10-CM

## 2014-08-18 DIAGNOSIS — M5432 Sciatica, left side: Secondary | ICD-10-CM

## 2014-08-18 MED ORDER — OXYCODONE HCL 10 MG PO TABS: 10.0000 mg | ORAL_TABLET | Freq: Four times a day (QID) | ORAL | Status: DC | PRN

## 2014-08-25 ENCOUNTER — Telehealth: Payer: Self-pay | Admitting: *Deleted

## 2014-08-25 DIAGNOSIS — B3731 Acute candidiasis of vulva and vagina: Secondary | ICD-10-CM

## 2014-08-25 DIAGNOSIS — B373 Candidiasis of vulva and vagina: Secondary | ICD-10-CM

## 2014-08-25 MED ORDER — FLUCONAZOLE 150 MG PO TABS: 150.0000 mg | ORAL_TABLET | Freq: Once | ORAL | Status: DC

## 2014-08-25 NOTE — Telephone Encounter (Signed)
Patient states she is having increased cottage cheese like discharge with itching and irritation. Per nursing protocol prescription for Diflucan sent to the pharmacy.

## 2014-08-31 ENCOUNTER — Ambulatory Visit (INDEPENDENT_AMBULATORY_CARE_PROVIDER_SITE_OTHER): Payer: Managed Care, Other (non HMO) | Admitting: Obstetrics

## 2014-08-31 ENCOUNTER — Encounter: Payer: Self-pay | Admitting: Obstetrics

## 2014-08-31 VITALS — BP 107/63 | HR 100 | Temp 98.2°F | Wt 136.0 lb

## 2014-08-31 DIAGNOSIS — Z3482 Encounter for supervision of other normal pregnancy, second trimester: Secondary | ICD-10-CM

## 2014-08-31 DIAGNOSIS — M5432 Sciatica, left side: Secondary | ICD-10-CM

## 2014-08-31 DIAGNOSIS — Z3483 Encounter for supervision of other normal pregnancy, third trimester: Secondary | ICD-10-CM

## 2014-08-31 LAB — POCT URINALYSIS DIPSTICK
Bilirubin, UA: NEGATIVE
Blood, UA: NEGATIVE
Glucose, UA: NEGATIVE
Ketones, UA: NEGATIVE
Leukocytes, UA: NEGATIVE
Nitrite, UA: NEGATIVE
Spec Grav, UA: 1.015
Urobilinogen, UA: NEGATIVE
pH, UA: 6

## 2014-08-31 MED ORDER — OXYCODONE HCL 10 MG PO TABS: 10.0000 mg | ORAL_TABLET | Freq: Four times a day (QID) | ORAL | Status: DC | PRN

## 2014-08-31 NOTE — Progress Notes (Signed)
Subjective:    Debra Gibbs is a 34 y.o. female being seen today for her obstetrical visit. She is at [redacted]w[redacted]d gestation. Patient reports backache. Fetal movement: normal.  Problem List Items Addressed This Visit    None    Visit Diagnoses    Encounter for supervision of other normal pregnancy in third trimester    -  Primary    Relevant Orders    POCT urinalysis dipstick (Completed)    Encounter for supervision of other normal pregnancy in second trimester        Relevant Medications    Oxycodone HCl 10 MG TABS    Sciatica associated with disorder of lumbar spine, left        Relevant Medications    Oxycodone HCl 10 MG TABS      Patient Active Problem List   Diagnosis Date Noted  . Acute frontal sinusitis 04/20/2014  . Healthcare maintenance 05/08/2013  . Dactylitis 03/04/2012  . Hemorrhoids 12/22/2011  . RHEUMATOID ARTHRITIS 11/04/2009  . NEURODERMATITIS 04/29/2007  . ANXIETY 12/24/2006   Objective:    BP 107/63 mmHg  Pulse 100  Temp(Src) 98.2 F (36.8 C)  Wt 136 lb (61.689 kg)  LMP 01/11/2014 FHT:  150 BPM  Uterine Size: size equals dates  Presentation: unsure     Assessment:    Pregnancy @ [redacted]w[redacted]d weeks   Plan:     labs reviewed, problem list updated Consent signed. GBS sent TDAP offered  Rhogam given for RH negative Pediatrician: discussed. Infant feeding: plans to breastfeed. Maternity leave: discussed. Cigarette smoking: former smoker. Orders Placed This Encounter  Procedures  . POCT urinalysis dipstick   Meds ordered this encounter  Medications  . Oxycodone HCl 10 MG TABS    Sig: Take 1 tablet (10 mg total) by mouth every 6 (six) hours as needed.    Dispense:  40 tablet    Refill:  0   Follow up in 2 Weeks.

## 2014-09-14 ENCOUNTER — Encounter: Payer: Self-pay | Admitting: Obstetrics

## 2014-09-14 ENCOUNTER — Ambulatory Visit (INDEPENDENT_AMBULATORY_CARE_PROVIDER_SITE_OTHER): Payer: Managed Care, Other (non HMO) | Admitting: Obstetrics

## 2014-09-14 VITALS — BP 101/66 | HR 88 | Temp 99.1°F | Wt 137.0 lb

## 2014-09-14 DIAGNOSIS — Z3483 Encounter for supervision of other normal pregnancy, third trimester: Secondary | ICD-10-CM

## 2014-09-14 DIAGNOSIS — Z3482 Encounter for supervision of other normal pregnancy, second trimester: Secondary | ICD-10-CM

## 2014-09-14 DIAGNOSIS — M5432 Sciatica, left side: Secondary | ICD-10-CM

## 2014-09-14 LAB — POCT URINALYSIS DIPSTICK
Bilirubin, UA: NEGATIVE
Blood, UA: NEGATIVE
Ketones, UA: NEGATIVE
Nitrite, UA: NEGATIVE
Spec Grav, UA: 1.02
Urobilinogen, UA: NEGATIVE
pH, UA: 6

## 2014-09-14 MED ORDER — OXYCODONE HCL 10 MG PO TABS: 10.0000 mg | ORAL_TABLET | Freq: Four times a day (QID) | ORAL | Status: DC | PRN

## 2014-09-14 NOTE — Progress Notes (Signed)
Subjective:    Debra Gibbs is a 34 y.o. female being seen today for her obstetrical visit. She is at [redacted]w[redacted]d gestation. Patient reports no complaints. Fetal movement: normal.  Problem List Items Addressed This Visit    None    Visit Diagnoses    Encounter for supervision of other normal pregnancy in third trimester    -  Primary    Relevant Orders    POCT urinalysis dipstick (Completed)    Strep B DNA probe    Encounter for supervision of other normal pregnancy in second trimester        Relevant Medications    Oxycodone HCl 10 MG TABS    Sciatica associated with disorder of lumbar spine, left        Relevant Medications    Oxycodone HCl 10 MG TABS      Patient Active Problem List   Diagnosis Date Noted  . Acute frontal sinusitis 04/20/2014  . Healthcare maintenance 05/08/2013  . Dactylitis 03/04/2012  . Hemorrhoids 12/22/2011  . RHEUMATOID ARTHRITIS 11/04/2009  . NEURODERMATITIS 04/29/2007  . ANXIETY 12/24/2006   Objective:    BP 101/66 mmHg  Pulse 88  Temp(Src) 99.1 F (37.3 C)  Wt 137 lb (62.143 kg)  LMP 01/11/2014 FHT:  160 BPM  Uterine Size: size equals dates  Presentation: unsure     Assessment:    Pregnancy @ [redacted]w[redacted]d weeks   Plan:     labs reviewed, problem list updated Consent signed. GBS sent TDAP offered  Rhogam given for RH negative Pediatrician: discussed. Infant feeding: plans to breastfeed. Maternity leave: discussed. Cigarette smoking: former smoker. Orders Placed This Encounter  Procedures  . Strep B DNA probe  . POCT urinalysis dipstick   Meds ordered this encounter  Medications  . Oxycodone HCl 10 MG TABS    Sig: Take 1 tablet (10 mg total) by mouth every 6 (six) hours as needed.    Dispense:  40 tablet    Refill:  0   Follow up in 1 Week.

## 2014-09-15 LAB — STREP B DNA PROBE: GBSP: NOT DETECTED

## 2014-09-21 ENCOUNTER — Encounter: Payer: Self-pay | Admitting: Obstetrics

## 2014-09-21 ENCOUNTER — Ambulatory Visit (INDEPENDENT_AMBULATORY_CARE_PROVIDER_SITE_OTHER): Payer: Managed Care, Other (non HMO) | Admitting: Obstetrics

## 2014-09-21 VITALS — BP 108/75 | HR 84 | Temp 98.0°F | Wt 137.0 lb

## 2014-09-21 DIAGNOSIS — Z3483 Encounter for supervision of other normal pregnancy, third trimester: Secondary | ICD-10-CM

## 2014-09-21 LAB — POCT URINALYSIS DIPSTICK
Bilirubin, UA: NEGATIVE
Blood, UA: NEGATIVE
Glucose, UA: NEGATIVE
Ketones, UA: NEGATIVE
Leukocytes, UA: NEGATIVE
Nitrite, UA: NEGATIVE
Protein, UA: NEGATIVE
Spec Grav, UA: 1.015
Urobilinogen, UA: NEGATIVE
pH, UA: 6

## 2014-09-21 NOTE — Progress Notes (Signed)
Subjective:    Debra Gibbs is a 34 y.o. female being seen today for her obstetrical visit. She is at [redacted]w[redacted]d gestation. Patient reports occasional contractions. Fetal movement: normal.  Problem List Items Addressed This Visit    None     Patient Active Problem List   Diagnosis Date Noted  . Acute frontal sinusitis 04/20/2014  . Healthcare maintenance 05/08/2013  . Dactylitis 03/04/2012  . Hemorrhoids 12/22/2011  . RHEUMATOID ARTHRITIS 11/04/2009  . NEURODERMATITIS 04/29/2007  . ANXIETY 12/24/2006   Objective:    BP 108/75 mmHg  Pulse 84  Temp(Src) 98 F (36.7 C)  Wt 137 lb (62.143 kg)  LMP 01/11/2014 FHT:  160 BPM  Uterine Size: size equals dates  Presentation: cephalic     Assessment:    Pregnancy @ [redacted]w[redacted]d weeks   Plan:     labs reviewed, problem list updated Consent signed. GBS sent TDAP offered  Rhogam given for RH negative Pediatrician: discussed. Infant feeding: plans to breastfeed. Maternity leave: discussed. Cigarette smoking: former smoker. No orders of the defined types were placed in this encounter.   No orders of the defined types were placed in this encounter.   Follow up in 1 Week.

## 2014-09-24 ENCOUNTER — Encounter (HOSPITAL_COMMUNITY): Payer: Self-pay | Admitting: *Deleted

## 2014-09-24 ENCOUNTER — Inpatient Hospital Stay (HOSPITAL_COMMUNITY)
Admission: AD | Admit: 2014-09-24 | Discharge: 2014-09-24 | Disposition: A | Discharge status: Home / Self Care | Payer: Managed Care, Other (non HMO) | Source: Ambulatory Visit | Attending: Obstetrics | Admitting: Obstetrics

## 2014-09-24 DIAGNOSIS — Z3493 Encounter for supervision of normal pregnancy, unspecified, third trimester: Secondary | ICD-10-CM | POA: Diagnosis not present

## 2014-09-24 DIAGNOSIS — Z3A36 36 weeks gestation of pregnancy: Secondary | ICD-10-CM | POA: Diagnosis not present

## 2014-09-24 LAB — URINALYSIS, ROUTINE W REFLEX MICROSCOPIC
Bilirubin Urine: NEGATIVE
Glucose, UA: NEGATIVE mg/dL
Hgb urine dipstick: NEGATIVE
Ketones, ur: 40 mg/dL — AB
Leukocytes, UA: NEGATIVE
Nitrite: NEGATIVE
Protein, ur: NEGATIVE mg/dL
Specific Gravity, Urine: 1.015 (ref 1.005–1.030)
Urobilinogen, UA: 0.2 mg/dL (ref 0.0–1.0)
pH: 6 (ref 5.0–8.0)

## 2014-09-24 NOTE — MAU Note (Signed)
Contractions every 15 minutes since 5 pm. Denies vaginal bleeding or LOF. Positive fetal movement.

## 2014-09-28 ENCOUNTER — Ambulatory Visit (INDEPENDENT_AMBULATORY_CARE_PROVIDER_SITE_OTHER): Payer: Managed Care, Other (non HMO) | Admitting: Obstetrics

## 2014-09-28 ENCOUNTER — Encounter: Payer: Self-pay | Admitting: Obstetrics

## 2014-09-28 VITALS — BP 130/85 | HR 105 | Temp 98.4°F | Wt 139.0 lb

## 2014-09-28 DIAGNOSIS — Z3483 Encounter for supervision of other normal pregnancy, third trimester: Secondary | ICD-10-CM | POA: Diagnosis not present

## 2014-09-28 DIAGNOSIS — Z3482 Encounter for supervision of other normal pregnancy, second trimester: Secondary | ICD-10-CM

## 2014-09-28 DIAGNOSIS — M5432 Sciatica, left side: Secondary | ICD-10-CM

## 2014-09-28 LAB — POCT URINALYSIS DIPSTICK
Bilirubin, UA: NEGATIVE
Blood, UA: NEGATIVE
Glucose, UA: NEGATIVE
Ketones, UA: NEGATIVE
Nitrite, UA: NEGATIVE
Protein, UA: NEGATIVE
Spec Grav, UA: 1.015
Urobilinogen, UA: NEGATIVE
pH, UA: 7

## 2014-09-28 MED ORDER — OXYCODONE HCL 10 MG PO TABS: 10.0000 mg | ORAL_TABLET | Freq: Four times a day (QID) | ORAL | Status: DC | PRN

## 2014-09-28 NOTE — Progress Notes (Signed)
Subjective:    Debra Gibbs is a 34 y.o. female being seen today for her obstetrical visit. She is at [redacted]w[redacted]d gestation. Patient reports no complaints. Fetal movement: normal.  Problem List Items Addressed This Visit    None    Visit Diagnoses    Encounter for supervision of other normal pregnancy in third trimester    -  Primary    Relevant Orders    POCT urinalysis dipstick (Completed)    Encounter for supervision of other normal pregnancy in second trimester        Relevant Medications    Oxycodone HCl 10 MG TABS    Sciatica associated with disorder of lumbar spine, left        Relevant Medications    Oxycodone HCl 10 MG TABS      Patient Active Problem List   Diagnosis Date Noted  . Acute frontal sinusitis 04/20/2014  . Healthcare maintenance 05/08/2013  . Dactylitis 03/04/2012  . Hemorrhoids 12/22/2011  . RHEUMATOID ARTHRITIS 11/04/2009  . NEURODERMATITIS 04/29/2007  . ANXIETY 12/24/2006    Objective:    BP 130/85 mmHg  Pulse 105  Temp(Src) 98.4 F (36.9 C)  Wt 139 lb (63.05 kg)  LMP 01/11/2014 FHT: 150 BPM  Uterine Size: size equals dates  Presentations: unsure  Pelvic Exam: Deferred    Assessment:    Pregnancy @ [redacted]w[redacted]d weeks   Plan:   Plans for delivery: Vaginal anticipated; labs reviewed; problem list updated Counseling: Consent signed. Infant feeding: plans to breastfeed. Cigarette smoking: former smoker. L&D discussion: symptoms of labor, discussed when to call, discussed what number to call, anesthetic/analgesic options reviewed and delivering clinician:  plans no preference. Postpartum supports and preparation: circumcision discussed and contraception plans discussed.  Follow up in 1 Week.

## 2014-09-29 ENCOUNTER — Telehealth: Payer: Self-pay | Admitting: *Deleted

## 2014-09-29 NOTE — Telephone Encounter (Signed)
Patient called the office stating she had been in severe pain since this morning.  Attempted to contact the patient and left message for patient to be seen at [facility] if pain continues or gets worse.

## 2014-09-30 ENCOUNTER — Inpatient Hospital Stay (HOSPITAL_COMMUNITY)
Admission: AD | Admit: 2014-09-30 | Discharge: 2014-09-30 | Disposition: A | Discharge status: Home / Self Care | Payer: Managed Care, Other (non HMO) | Source: Ambulatory Visit | Attending: Obstetrics | Admitting: Obstetrics

## 2014-09-30 ENCOUNTER — Encounter (HOSPITAL_COMMUNITY): Payer: Self-pay | Admitting: *Deleted

## 2014-09-30 DIAGNOSIS — Z3493 Encounter for supervision of normal pregnancy, unspecified, third trimester: Secondary | ICD-10-CM | POA: Diagnosis not present

## 2014-09-30 DIAGNOSIS — Z3A37 37 weeks gestation of pregnancy: Secondary | ICD-10-CM | POA: Insufficient documentation

## 2014-09-30 MED ORDER — NALBUPHINE HCL 10 MG/ML IJ SOLN: 10.0000 mg | Freq: Once | INTRAMUSCULAR | Status: DC

## 2014-09-30 MED ORDER — MORPHINE SULFATE 10 MG/ML IJ SOLN
10.0000 mg | Freq: Once | INTRAMUSCULAR | Status: AC
  Administered 2014-09-30: 10 mg via INTRAMUSCULAR
  Filled 2014-09-30: qty 1

## 2014-09-30 MED ORDER — PROMETHAZINE HCL 25 MG/ML IJ SOLN
25.0000 mg | Freq: Once | INTRAMUSCULAR | Status: AC
  Administered 2014-09-30: 25 mg via INTRAMUSCULAR
  Filled 2014-09-30: qty 1

## 2014-09-30 NOTE — MAU Note (Signed)
PT  SAYS SHE HURTS  BAD  SINCE 0100.  VOMITED AT 0230.   VE  ON Thursday  1-2  CM    DENIES HSV AND MRSA.   GBS- NEG

## 2014-09-30 NOTE — MAU Note (Signed)
Pt stated she started ctx at 5am yesterday, they have progressively gotten worse at 1am. Pt is unable to rest. Pain 9/10. Denies LOF or VB. +FM.

## 2014-10-05 ENCOUNTER — Ambulatory Visit (INDEPENDENT_AMBULATORY_CARE_PROVIDER_SITE_OTHER): Payer: Managed Care, Other (non HMO) | Admitting: Obstetrics

## 2014-10-05 ENCOUNTER — Telehealth (HOSPITAL_COMMUNITY): Payer: Self-pay | Admitting: *Deleted

## 2014-10-05 ENCOUNTER — Encounter: Payer: Self-pay | Admitting: Obstetrics

## 2014-10-05 VITALS — BP 103/68 | HR 85 | Temp 98.3°F | Wt 140.0 lb

## 2014-10-05 DIAGNOSIS — G4459 Other complicated headache syndrome: Secondary | ICD-10-CM

## 2014-10-05 DIAGNOSIS — Z3483 Encounter for supervision of other normal pregnancy, third trimester: Secondary | ICD-10-CM

## 2014-10-05 DIAGNOSIS — M5432 Sciatica, left side: Secondary | ICD-10-CM

## 2014-10-05 DIAGNOSIS — Z3482 Encounter for supervision of other normal pregnancy, second trimester: Secondary | ICD-10-CM

## 2014-10-05 LAB — POCT URINALYSIS DIPSTICK
Bilirubin, UA: NEGATIVE
Blood, UA: NEGATIVE
Glucose, UA: NORMAL
Ketones, UA: NEGATIVE
Leukocytes, UA: NEGATIVE
Nitrite, UA: NEGATIVE
Protein, UA: NEGATIVE
Spec Grav, UA: 1.015
Urobilinogen, UA: NEGATIVE
pH, UA: 6

## 2014-10-05 MED ORDER — BUTALBITAL-APAP-CAFFEINE 50-325-40 MG PO TABS
2.0000 | ORAL_TABLET | Freq: Four times a day (QID) | ORAL | Status: DC | PRN
Start: 2014-10-05 — End: 2015-07-02

## 2014-10-05 NOTE — Telephone Encounter (Signed)
Preadmission screen  

## 2014-10-05 NOTE — Progress Notes (Signed)
Subjective:    Debra Gibbs is a 34 y.o. female being seen today for her obstetrical visit. She is at [redacted]w[redacted]d gestation. Patient reports no complaints. Fetal movement: normal.  Problem List Items Addressed This Visit    None    Visit Diagnoses    Other complicated headache syndrome    -  Primary    Relevant Medications    butalbital-acetaminophen-caffeine (FIORICET) 50-325-40 MG per tablet    Supervision of other normal pregnancy, antepartum, third trimester        Relevant Orders    POCT urinalysis dipstick (Completed)      Patient Active Problem List   Diagnosis Date Noted  . Acute frontal sinusitis 04/20/2014  . Healthcare maintenance 05/08/2013  . Dactylitis 03/04/2012  . Hemorrhoids 12/22/2011  . RHEUMATOID ARTHRITIS 11/04/2009  . NEURODERMATITIS 04/29/2007  . ANXIETY 12/24/2006    Objective:    BP 103/68 mmHg  Pulse 85  Temp(Src) 98.3 F (36.8 C)  Wt 140 lb (63.504 kg)  LMP 01/11/2014 FHT: 150 BPM  Uterine Size: size equals dates  Presentations: cephalic  Pelvic Exam:              Dilation: 2cm       Effacement: 50%             Station:  -3    Consistency: soft            Position: posterior     Assessment:    Pregnancy @ [redacted]w[redacted]d weeks   Plan:   Plans for delivery: Vaginal anticipated; labs reviewed; problem list updated Counseling: Consent signed. Infant feeding: plans to breastfeed. Cigarette smoking: former smoker. L&D discussion: symptoms of labor, discussed when to call, discussed what number to call, anesthetic/analgesic options reviewed and delivering clinician:  plans no preference. Postpartum supports and preparation: circumcision discussed and contraception plans discussed.  Follow up in 1 Week.

## 2014-10-08 ENCOUNTER — Telehealth: Payer: Self-pay | Admitting: *Deleted

## 2014-10-08 NOTE — Telephone Encounter (Signed)
Patient is having a lot of pelvic and groin pain. She is resting which is causing hand and arm numbness. Pain medication isn't working anymore. Encouraged comfort measures, distraction, induction scheduled 7/5- patient to try to hang in and tough it out- she is just miserable at this piont. She is aware she can not go before 39 weeks without medical reason- but if she should get worse in any other way she is to go to [facility].

## 2014-10-09 ENCOUNTER — Other Ambulatory Visit: Payer: Self-pay | Admitting: Obstetrics

## 2014-10-09 DIAGNOSIS — M5432 Sciatica, left side: Secondary | ICD-10-CM

## 2014-10-09 DIAGNOSIS — Z3482 Encounter for supervision of other normal pregnancy, second trimester: Secondary | ICD-10-CM

## 2014-10-09 MED ORDER — OXYCODONE HCL 10 MG PO TABS: 10.0000 mg | ORAL_TABLET | Freq: Four times a day (QID) | ORAL | Status: DC | PRN

## 2014-10-09 NOTE — Addendum Note (Signed)
Addended by: Baltazar Najjar A on: 10/09/2014 12:34 PM   Modules accepted: Orders

## 2014-10-13 ENCOUNTER — Encounter: Payer: Managed Care, Other (non HMO) | Admitting: Obstetrics

## 2014-10-13 ENCOUNTER — Encounter (HOSPITAL_COMMUNITY): Payer: Self-pay

## 2014-10-13 ENCOUNTER — Inpatient Hospital Stay (HOSPITAL_COMMUNITY)
Admission: RE | Admit: 2014-10-13 | Discharge: 2014-10-16 | DRG: 775 | Disposition: A | Discharge status: Home / Self Care | Payer: Managed Care, Other (non HMO) | Source: Ambulatory Visit | Attending: Obstetrics | Admitting: Obstetrics

## 2014-10-13 DIAGNOSIS — O9989 Other specified diseases and conditions complicating pregnancy, childbirth and the puerperium: Secondary | ICD-10-CM | POA: Diagnosis present

## 2014-10-13 DIAGNOSIS — F419 Anxiety disorder, unspecified: Secondary | ICD-10-CM | POA: Diagnosis present

## 2014-10-13 DIAGNOSIS — Z3A39 39 weeks gestation of pregnancy: Secondary | ICD-10-CM | POA: Diagnosis present

## 2014-10-13 DIAGNOSIS — M47896 Other spondylosis, lumbar region: Secondary | ICD-10-CM | POA: Diagnosis present

## 2014-10-13 DIAGNOSIS — M069 Rheumatoid arthritis, unspecified: Secondary | ICD-10-CM | POA: Diagnosis present

## 2014-10-13 DIAGNOSIS — G4700 Insomnia, unspecified: Secondary | ICD-10-CM | POA: Diagnosis present

## 2014-10-13 DIAGNOSIS — R51 Headache: Secondary | ICD-10-CM | POA: Diagnosis present

## 2014-10-13 DIAGNOSIS — Z87891 Personal history of nicotine dependence: Secondary | ICD-10-CM | POA: Diagnosis not present

## 2014-10-13 DIAGNOSIS — M545 Low back pain: Secondary | ICD-10-CM | POA: Diagnosis present

## 2014-10-13 DIAGNOSIS — O99344 Other mental disorders complicating childbirth: Secondary | ICD-10-CM | POA: Diagnosis present

## 2014-10-13 DIAGNOSIS — F329 Major depressive disorder, single episode, unspecified: Secondary | ICD-10-CM | POA: Diagnosis present

## 2014-10-13 DIAGNOSIS — O99354 Diseases of the nervous system complicating childbirth: Secondary | ICD-10-CM | POA: Diagnosis present

## 2014-10-13 DIAGNOSIS — G8929 Other chronic pain: Secondary | ICD-10-CM | POA: Diagnosis present

## 2014-10-13 LAB — CBC
HCT: 28.7 % — ABNORMAL LOW (ref 36.0–46.0)
Hemoglobin: 9.4 g/dL — ABNORMAL LOW (ref 12.0–15.0)
MCH: 27.6 pg (ref 26.0–34.0)
MCHC: 32.8 g/dL (ref 30.0–36.0)
MCV: 84.2 fL (ref 78.0–100.0)
Platelets: 189 10*3/uL (ref 150–400)
RBC: 3.41 MIL/uL — ABNORMAL LOW (ref 3.87–5.11)
RDW: 14.1 % (ref 11.5–15.5)
WBC: 7.5 10*3/uL (ref 4.0–10.5)

## 2014-10-13 LAB — TYPE AND SCREEN
ABO/RH(D): O POS
Antibody Screen: NEGATIVE

## 2014-10-13 LAB — ABO/RH: ABO/RH(D): O POS

## 2014-10-13 LAB — RPR: RPR Ser Ql: NONREACTIVE

## 2014-10-13 MED ORDER — PROMETHAZINE HCL 25 MG/ML IJ SOLN
25.0000 mg | Freq: Four times a day (QID) | INTRAMUSCULAR | Status: DC | PRN
  Administered 2014-10-13: 25 mg via INTRAMUSCULAR

## 2014-10-13 MED ORDER — OXYTOCIN 40 UNITS IN LACTATED RINGERS INFUSION - SIMPLE MED
1.0000 m[IU]/min | INTRAVENOUS | Status: DC
  Filled 2014-10-13: qty 1000

## 2014-10-13 MED ORDER — LIDOCAINE HCL (PF) 1 % IJ SOLN
30.0000 mL | INTRAMUSCULAR | Status: DC | PRN
  Filled 2014-10-13: qty 30

## 2014-10-13 MED ORDER — ACETAMINOPHEN 325 MG PO TABS: 650.0000 mg | ORAL_TABLET | ORAL | Status: DC | PRN

## 2014-10-13 MED ORDER — FENTANYL 2.5 MCG/ML BUPIVACAINE 1/10 % EPIDURAL INFUSION (WH - ANES)
14.0000 mL/h | INTRAMUSCULAR | Status: DC | PRN
  Administered 2014-10-14: 14 mL/h via EPIDURAL
  Administered 2014-10-14: 12 mL/h via EPIDURAL
  Filled 2014-10-13: qty 125

## 2014-10-13 MED ORDER — CITRIC ACID-SODIUM CITRATE 334-500 MG/5ML PO SOLN: 30.0000 mL | ORAL | Status: DC | PRN

## 2014-10-13 MED ORDER — EPHEDRINE 5 MG/ML INJ: 10.0000 mg | INTRAVENOUS | Status: DC | PRN

## 2014-10-13 MED ORDER — ONDANSETRON HCL 4 MG/2ML IJ SOLN
4.0000 mg | Freq: Four times a day (QID) | INTRAMUSCULAR | Status: DC | PRN
Start: 2014-10-13 — End: 2014-10-14

## 2014-10-13 MED ORDER — NALBUPHINE HCL 10 MG/ML IJ SOLN
10.0000 mg | Freq: Four times a day (QID) | INTRAMUSCULAR | Status: DC | PRN
  Administered 2014-10-13 (×2): 10 mg via INTRAMUSCULAR
  Filled 2014-10-13 (×2): qty 1

## 2014-10-13 MED ORDER — PROMETHAZINE HCL 25 MG/ML IJ SOLN
25.0000 mg | Freq: Four times a day (QID) | INTRAMUSCULAR | Status: DC | PRN
Start: 2014-10-13 — End: 2014-10-13
  Administered 2014-10-13: 25 mg via INTRAVENOUS
  Filled 2014-10-13 (×2): qty 1

## 2014-10-13 MED ORDER — TERBUTALINE SULFATE 1 MG/ML IJ SOLN: 0.2500 mg | Freq: Once | INTRAMUSCULAR | Status: AC | PRN

## 2014-10-13 MED ORDER — OXYTOCIN BOLUS FROM INFUSION
500.0000 mL | INTRAVENOUS | Status: DC
  Administered 2014-10-14: 500 mL via INTRAVENOUS

## 2014-10-13 MED ORDER — DIPHENHYDRAMINE HCL 50 MG/ML IJ SOLN
12.5000 mg | INTRAMUSCULAR | Status: DC | PRN
Start: 2014-10-13 — End: 2014-10-14

## 2014-10-13 MED ORDER — LACTATED RINGERS IV SOLN
500.0000 mL | INTRAVENOUS | Status: DC | PRN
  Administered 2014-10-14 (×2): 500 mL via INTRAVENOUS

## 2014-10-13 MED ORDER — LACTATED RINGERS IV SOLN
INTRAVENOUS | Status: DC
  Administered 2014-10-13 – 2014-10-14 (×3): via INTRAVENOUS

## 2014-10-13 MED ORDER — NALBUPHINE HCL 10 MG/ML IJ SOLN
10.0000 mg | INTRAMUSCULAR | Status: DC | PRN
  Administered 2014-10-13 (×2): 10 mg via INTRAVENOUS
  Filled 2014-10-13 (×2): qty 1

## 2014-10-13 MED ORDER — ZOLPIDEM TARTRATE 5 MG PO TABS
5.0000 mg | ORAL_TABLET | Freq: Every evening | ORAL | Status: DC | PRN
  Administered 2014-10-13: 5 mg via ORAL
  Filled 2014-10-13: qty 1

## 2014-10-13 MED ORDER — OXYTOCIN 40 UNITS IN LACTATED RINGERS INFUSION - SIMPLE MED: 62.5000 mL/h | INTRAVENOUS | Status: DC

## 2014-10-13 MED ORDER — MISOPROSTOL 50MCG HALF TABLET
50.0000 ug | ORAL_TABLET | ORAL | Status: DC | PRN
  Administered 2014-10-13 – 2014-10-14 (×3): 50 ug via ORAL
  Filled 2014-10-13 (×6): qty 0.5

## 2014-10-13 MED ORDER — OXYCODONE-ACETAMINOPHEN 5-325 MG PO TABS: 2.0000 | ORAL_TABLET | ORAL | Status: DC | PRN

## 2014-10-13 MED ORDER — OXYCODONE-ACETAMINOPHEN 5-325 MG PO TABS
1.0000 | ORAL_TABLET | ORAL | Status: DC | PRN
  Administered 2014-10-14: 1 via ORAL
  Filled 2014-10-13: qty 1

## 2014-10-13 MED ORDER — PHENYLEPHRINE 40 MCG/ML (10ML) SYRINGE FOR IV PUSH (FOR BLOOD PRESSURE SUPPORT)
80.0000 ug | PREFILLED_SYRINGE | INTRAVENOUS | Status: DC | PRN
  Filled 2014-10-13: qty 20

## 2014-10-13 NOTE — H&P (Signed)
Debra Gibbs is a 34 y.o. female presenting for elective IOL at 52 weeks.  H/O chronic low back pain from DJD of lumbar vertebrae. History OB History    Gravida Para Term Preterm AB TAB SAB Ectopic Multiple Living   5 2 2  2  2   2      Past Medical History  Diagnosis Date  . Anxiety   . Rheumatoid arthritis(714.0)   . Headache     with pregnancy only   Past Surgical History  Procedure Laterality Date  . Wisdom tooth extraction     Family History: family history includes Arthritis in her father. Social History:  reports that she quit smoking about 9 years ago. She has never used smokeless tobacco. She reports that she does not drink alcohol or use illicit drugs.   Prenatal Transfer Tool  Maternal Diabetes: No Genetic Screening: Normal Maternal Ultrasounds/Referrals: Normal Fetal Ultrasounds or other Referrals:  None Maternal Substance Abuse:  No Significant Maternal Medications:  Meds include: Other:  Significant Maternal Lab Results:  None Other Comments:  None  Review of Systems  Musculoskeletal: Positive for myalgias and back pain.  Psychiatric/Behavioral: Positive for depression. The patient has insomnia.   All other systems reviewed and are negative.   Dilation: 2.5 Effacement (%): 50 Station: -3 Exam by:: k fields, rn Blood pressure 113/67, pulse 92, temperature 98.1 F (36.7 C), temperature source Oral, resp. rate 20, height 5\' 2"  (1.575 m), weight 137 lb (62.143 kg), last menstrual period 01/11/2014. Maternal Exam:  Abdomen: Patient reports no abdominal tenderness. Pelvis: adequate for delivery.   Cervix: Cervix evaluated by digital exam.     Physical Exam  Nursing note and vitals reviewed. Constitutional: She is oriented to person, place, and time. She appears well-developed and well-nourished.  HENT:  Head: Normocephalic and atraumatic.  Eyes: Conjunctivae are normal. Pupils are equal, round, and reactive to light.  Neck: Normal range of motion. Neck  supple.  Cardiovascular: Normal rate and regular rhythm.   Respiratory: Effort normal and breath sounds normal.  GI: Soft.  Genitourinary: Vagina normal and uterus normal.  Musculoskeletal: Normal range of motion.  Neurological: She is alert and oriented to person, place, and time.  Skin: Skin is warm and dry.  Psychiatric: She has a normal mood and affect. Her behavior is normal. Judgment and thought content normal.    Prenatal labs: ABO, Rh: --/--/O POS (07/05 0750) Antibody: NEG (07/05 0750) Rubella: 7.06 (01/06 1140) RPR: NON REAC (03/25 1224)  HBsAg: NEGATIVE (01/06 1140)  HIV: NONREACTIVE (03/25 1224)  GBS: NOT DETECTED (06/06 1148)   Assessment/Plan: 39.1 weeks.  Chronic low backache.  Elective IOL at 39 weeks.  Cytotec per protocol.   Jamelia Varano A 10/13/2014, 11:23 AM

## 2014-10-13 NOTE — Progress Notes (Signed)
Debra Gibbs is a 34 y.o. [id number] at [redacted]w[redacted]d by ultrasound admitted for induction of labor due to Elective at term.  Subjective:   Objective: BP 113/67 mmHg  Pulse 92  Temp(Src) 98.1 F (36.7 C) (Oral)  Resp 20  Ht 5\' 2"  (1.575 m)  Wt 137 lb (62.143 kg)  BMI 25.05 kg/m2  LMP 01/11/2014      FHT:  FHR: 145 bpm, variability: moderate,  accelerations:  Present,  decelerations:  Absent UC:   irregular, every 10 minutes SVE:   Dilation: 2.5 Effacement (%): 50 Station: -3 Exam by:: k fields, rn  Labs: Lab Results  Component Value Date   WBC 7.5 10/13/2014   HGB 9.4* 10/13/2014   HCT 28.7* 10/13/2014   MCV 84.2 10/13/2014   PLT 189 10/13/2014    Assessment / Plan: IOL elective at term, with Cytotec Reactive Cat1 NST  Labor: Progressing normally Preeclampsia:  N/A Fetal Wellbeing:  Category I Pain Control:  Labor support without medications, Epidural and IVP pain medications until 3-4 cm dilation then epidural I/D:  n/a Anticipated MOD:  NSVD  Morene Crocker, CNM 10/13/2014, 8:44 AM

## 2014-10-14 ENCOUNTER — Inpatient Hospital Stay (HOSPITAL_COMMUNITY): Payer: Managed Care, Other (non HMO) | Admitting: Anesthesiology

## 2014-10-14 ENCOUNTER — Encounter (HOSPITAL_COMMUNITY): Payer: Self-pay

## 2014-10-14 MED ORDER — OXYCODONE-ACETAMINOPHEN 5-325 MG PO TABS
1.0000 | ORAL_TABLET | ORAL | Status: DC | PRN
Start: 2014-10-14 — End: 2014-10-16
  Administered 2014-10-14 – 2014-10-16 (×8): 1 via ORAL
  Filled 2014-10-14 (×8): qty 1

## 2014-10-14 MED ORDER — ONDANSETRON HCL 4 MG PO TABS: 4.0000 mg | ORAL_TABLET | ORAL | Status: DC | PRN

## 2014-10-14 MED ORDER — METHYLERGONOVINE MALEATE 0.2 MG/ML IJ SOLN: 0.2000 mg | INTRAMUSCULAR | Status: DC | PRN

## 2014-10-14 MED ORDER — OXYTOCIN 10 UNIT/ML IJ SOLN
INTRAMUSCULAR | Status: AC
  Administered 2014-10-14: 10 [IU]
  Filled 2014-10-14: qty 1

## 2014-10-14 MED ORDER — DIPHENHYDRAMINE HCL 25 MG PO CAPS: 25.0000 mg | ORAL_CAPSULE | Freq: Four times a day (QID) | ORAL | Status: DC | PRN

## 2014-10-14 MED ORDER — OXYCODONE HCL 5 MG PO TABS: 10.0000 mg | ORAL_TABLET | Freq: Four times a day (QID) | ORAL | Status: DC | PRN

## 2014-10-14 MED ORDER — METHYLERGONOVINE MALEATE 0.2 MG PO TABS: 0.2000 mg | ORAL_TABLET | ORAL | Status: DC | PRN

## 2014-10-14 MED ORDER — FLINSTONES GUMMIES OMEGA-3 DHA PO CHEW: 2.0000 | CHEWABLE_TABLET | Freq: Every day | ORAL | Status: DC

## 2014-10-14 MED ORDER — BUTALBITAL-APAP-CAFFEINE 50-325-40 MG PO TABS: 2.0000 | ORAL_TABLET | Freq: Four times a day (QID) | ORAL | Status: DC | PRN

## 2014-10-14 MED ORDER — IBUPROFEN 600 MG PO TABS
600.0000 mg | ORAL_TABLET | Freq: Four times a day (QID) | ORAL | Status: DC
  Administered 2014-10-14 – 2014-10-16 (×9): 600 mg via ORAL
  Filled 2014-10-14 (×9): qty 1

## 2014-10-14 MED ORDER — TRAMADOL HCL 50 MG PO TABS: 50.0000 mg | ORAL_TABLET | Freq: Four times a day (QID) | ORAL | Status: DC | PRN

## 2014-10-14 MED ORDER — LIDOCAINE-EPINEPHRINE (PF) 2 %-1:200000 IJ SOLN
INTRAMUSCULAR | Status: DC | PRN
  Administered 2014-10-14: 3 mL

## 2014-10-14 MED ORDER — BENZOCAINE-MENTHOL 20-0.5 % EX AERO
1.0000 "application " | INHALATION_SPRAY | CUTANEOUS | Status: DC | PRN
  Administered 2014-10-14: 1 via TOPICAL
  Filled 2014-10-14: qty 56

## 2014-10-14 MED ORDER — SENNOSIDES-DOCUSATE SODIUM 8.6-50 MG PO TABS
2.0000 | ORAL_TABLET | ORAL | Status: DC
  Administered 2014-10-14 – 2014-10-15 (×2): 2 via ORAL
  Filled 2014-10-14 (×2): qty 2

## 2014-10-14 MED ORDER — ONDANSETRON HCL 4 MG/2ML IJ SOLN: 4.0000 mg | INTRAMUSCULAR | Status: DC | PRN

## 2014-10-14 MED ORDER — MISOPROSTOL 200 MCG PO TABS
ORAL_TABLET | ORAL | Status: AC
  Filled 2014-10-14: qty 5

## 2014-10-14 MED ORDER — LANOLIN HYDROUS EX OINT: TOPICAL_OINTMENT | CUTANEOUS | Status: DC | PRN

## 2014-10-14 MED ORDER — BUPIVACAINE HCL (PF) 0.25 % IJ SOLN
INTRAMUSCULAR | Status: DC | PRN
  Administered 2014-10-14 (×2): 4 mL

## 2014-10-14 MED ORDER — PRENATAL MULTIVITAMIN CH
1.0000 | ORAL_TABLET | Freq: Every day | ORAL | Status: DC
  Administered 2014-10-14 – 2014-10-15 (×2): 1 via ORAL
  Filled 2014-10-14 (×2): qty 1

## 2014-10-14 MED ORDER — ZOLPIDEM TARTRATE 5 MG PO TABS: 5.0000 mg | ORAL_TABLET | Freq: Every evening | ORAL | Status: DC | PRN

## 2014-10-14 MED ORDER — WITCH HAZEL-GLYCERIN EX PADS: 1.0000 "application " | MEDICATED_PAD | CUTANEOUS | Status: DC | PRN

## 2014-10-14 MED ORDER — OXYTOCIN 10 UNIT/ML IJ SOLN: 10.0000 [IU] | Freq: Once | INTRAMUSCULAR | Status: DC

## 2014-10-14 MED ORDER — DIBUCAINE 1 % RE OINT: 1.0000 "application " | TOPICAL_OINTMENT | RECTAL | Status: DC | PRN

## 2014-10-14 MED ORDER — OXYTOCIN 40 UNITS IN LACTATED RINGERS INFUSION - SIMPLE MED: 62.5000 mL/h | INTRAVENOUS | Status: DC | PRN

## 2014-10-14 MED ORDER — SIMETHICONE 80 MG PO CHEW
80.0000 mg | CHEWABLE_TABLET | ORAL | Status: DC | PRN
  Filled 2014-10-14: qty 1

## 2014-10-14 MED ORDER — ACETAMINOPHEN 325 MG PO TABS: 650.0000 mg | ORAL_TABLET | ORAL | Status: DC | PRN

## 2014-10-14 MED ORDER — MISOPROSTOL 200 MCG PO TABS
1000.0000 ug | ORAL_TABLET | Freq: Once | ORAL | Status: AC
  Administered 2014-10-14: 1000 ug via VAGINAL

## 2014-10-14 MED ORDER — OXYCODONE-ACETAMINOPHEN 5-325 MG PO TABS: 2.0000 | ORAL_TABLET | ORAL | Status: DC | PRN

## 2014-10-14 MED ORDER — TETANUS-DIPHTH-ACELL PERTUSSIS 5-2.5-18.5 LF-MCG/0.5 IM SUSP: 0.5000 mL | Freq: Once | INTRAMUSCULAR | Status: DC

## 2014-10-14 NOTE — Anesthesia Procedure Notes (Signed)
Epidural Patient location during procedure: OB  Staffing Anesthesiologist: Chariah Bailey, CHRIS Performed by: anesthesiologist   Preanesthetic Checklist Completed: patient identified, surgical consent, pre-op evaluation, timeout performed, IV checked, risks and benefits discussed and monitors and equipment checked  Epidural Patient position: sitting Prep: site prepped and draped and DuraPrep Patient monitoring: heart rate, cardiac monitor, continuous pulse ox and blood pressure Approach: midline Location: L3-L4 Injection technique: LOR saline  Needle:  Needle type: Tuohy  Needle gauge: 17 G Needle length: 9 cm Needle insertion depth: 6 cm Catheter type: closed end flexible Catheter size: 19 Gauge Catheter at skin depth: 12 cm Test dose: negative and 2% lidocaine with Epi 1:200 K  Assessment Events: blood not aspirated, injection not painful, no injection resistance, negative IV test and no paresthesia  Additional Notes H+P and labs checked, risks and benefits discussed with the patient, consent obtained, procedure tolerated well and without complications.  Reason for block:procedure for pain

## 2014-10-14 NOTE — Lactation Note (Signed)
This note was copied from the chart of Debra Gibbs. Lactation Consultation Note Follow up visit at 17 hours of age.  Mom reports a few good feedings and continues nipple pain.  Mom has more pain on left nipple and is hesitant to relatch baby due to pain.  Discussed deep latch and mom feels baby is doing well with wide open mouth.  Encouraged mom to hand express colostrum prior to feeding and after to rub EBM into nipples for comfort.  Discussed using a NS and fitted mom for a #20 after she used hand pump to help evert nipples.  #24 appear to big at this time and left in room if needed for later.  Mom is able to return demonstration of how to apply NS.  Baby asleep in crib at this time.  Questions answered.  Mom to call for assist as needed, and latch score during the night.    Patient Name: Debra Amelie Caracci [id number] Date: 10/14/2014 Reason for consult: Follow-up assessment;Breast/nipple pain;Difficult latch   Maternal Data    Feeding    LATCH Score/Interventions                      Lactation Tools Discussed/Used Tools: Nipple Shields Nipple shield size: 20 Breast pump type: Manual   Consult Status Consult Status: Follow-up Date: 10/15/14 Follow-up type: In-patient    Bayan Hedstrom, Justine Null 10/14/2014, 10:33 PM

## 2014-10-14 NOTE — Anesthesia Postprocedure Evaluation (Signed)
Anesthesia Post Note  Patient: Debra Gibbs  Procedure(s) Performed: * No procedures listed *  Anesthesia type: Epidural  Patient location: Mother/Baby  Post pain: Pain level controlled  Post assessment: Post-op Vital signs reviewed  Last Vitals:  Filed Vitals:   10/14/14 0631  BP: 116/59  Pulse: 75  Temp:   Resp: 18    Post vital signs: Reviewed  Level of consciousness: awake  Complications: No apparent anesthesia complications

## 2014-10-14 NOTE — Lactation Note (Signed)
This note was copied from the chart of Debra Gibbs. Lactation Consultation Note  Patient Name: Debra Gibbs Date: 10/14/2014 Reason for consult: Initial assessment;Difficult latch  Visited with Mom, baby 7 hrs old.  Mom resting in bed, with baby skin to skin.  Baby had just spoon fed 2 ml colostrum, as latch to breast is difficult.  Suggested she call when baby cues to feed next time.  Baby likes to suck on her tongue, and has a tight latch to breast.  Manual pumping, and suck training had been tried.  Mom very tired at present.  Brochure left in room.  Informed Mom of IP and OP lactation services available to her.  To follow up at next feeding, and in am. Consult Status Consult Status: Follow-up Date: 10/14/14 Follow-up type: [place] 10/14/2014, 12:58 PM

## 2014-10-14 NOTE — Anesthesia Preprocedure Evaluation (Signed)
Anesthesia Evaluation  Patient identified by MRN, date of birth, ID band Patient awake    Reviewed: Allergy & Precautions, Patient's Chart, lab work & pertinent test results  History of Anesthesia Complications Negative for: history of anesthetic complications  Airway Mallampati: II  TM Distance: >3 FB Neck ROM: Full    Dental  (+) Teeth Intact   Pulmonary neg shortness of breath, neg sleep apnea, neg COPDformer smoker,  breath sounds clear to auscultation        Cardiovascular negative cardio ROS  Rhythm:Regular     Neuro/Psych  Headaches, Anxiety    GI/Hepatic negative GI ROS, Neg liver ROS,   Endo/Other  negative endocrine ROS  Renal/GU negative Renal ROS     Musculoskeletal   Abdominal   Peds  Hematology  (+) anemia ,   Anesthesia Other Findings   Reproductive/Obstetrics (+) Pregnancy                             Anesthesia Physical Anesthesia Plan  ASA: II  Anesthesia Plan: Epidural   Post-op Pain Management:    Induction:   Airway Management Planned:   Additional Equipment:   Intra-op Plan:   Post-operative Plan:   Informed Consent: I have reviewed the patients History and Physical, chart, labs and discussed the procedure including the risks, benefits and alternatives for the proposed anesthesia with the patient or authorized representative who has indicated his/her understanding and acceptance.     Plan Discussed with: Anesthesiologist  Anesthesia Plan Comments:         Anesthesia Quick Evaluation

## 2014-10-15 LAB — CBC
HCT: 21.4 % — ABNORMAL LOW (ref 36.0–46.0)
Hemoglobin: 7 g/dL — ABNORMAL LOW (ref 12.0–15.0)
MCH: 27.6 pg (ref 26.0–34.0)
MCHC: 32.7 g/dL (ref 30.0–36.0)
MCV: 84.3 fL (ref 78.0–100.0)
Platelets: 163 10*3/uL (ref 150–400)
RBC: 2.54 MIL/uL — ABNORMAL LOW (ref 3.87–5.11)
RDW: 14.4 % (ref 11.5–15.5)
WBC: 11.8 10*3/uL — ABNORMAL HIGH (ref 4.0–10.5)

## 2014-10-15 MED ORDER — FERROUS SULFATE 325 (65 FE) MG PO TABS
325.0000 mg | ORAL_TABLET | Freq: Three times a day (TID) | ORAL | Status: DC
  Administered 2014-10-15 – 2014-10-16 (×3): 325 mg via ORAL
  Filled 2014-10-15 (×3): qty 1

## 2014-10-15 NOTE — Progress Notes (Signed)
MOB was referred for history of depression/anxiety.  Referral is screened out by Clinical Social Worker because none of the following criteria appear to apply: -History of anxiety/depression during this pregnancy, or of post-partum depression. - Diagnosis of anxiety and/or depression within last 3 years or -MOB's symptoms are currently being treated with medication and/or therapy.  CSW completed chart review. MOB diagnosed with anxiety in 2008.  No symptoms of anxiety noted in OB records.  Please contact the Clinical Social Worker if needs arise or upon MOB request.   Lucita Ferrara, LCSW [phone]

## 2014-10-15 NOTE — Progress Notes (Addendum)
Post Partum Day #1 Subjective: no complaints, up ad lib, voiding, tolerating PO and + flatus  Objective: Blood pressure 104/65, pulse 87, temperature 98.3 F (36.8 C), temperature source Oral, resp. rate 16, height 5\' 2"  (1.575 m), weight 137 lb (62.143 kg), last menstrual period 01/11/2014, SpO2 100 %, currently breastfeeding.  Physical Exam:  General: alert, cooperative, no distress, pale and denies vertigo or HA. Lochia: appropriate Uterine Fundus: firm Incision: N/A DVT Evaluation: No evidence of DVT seen on physical exam. Negative Homan's sign. No cords or calf tenderness. No significant calf/ankle edema.   Recent Labs  10/13/14 0750 10/15/14 0540  HGB 9.4* 7.0*  HCT 28.7* 21.4*    Assessment/Plan: Plan for discharge tomorrow, Breastfeeding, Lactation consult and Contraception plans for BTL referral.   LOS: 2 days   Rachelle A Denney 10/15/2014, 8:18 AM

## 2014-10-15 NOTE — Lactation Note (Signed)
This note was copied from the chart of Debra Cana Gibbs. Lactation Consultation Note  Patient Name: Debra Deyanna Mctier [id number]'M Date: 10/15/2014 Reason for consult: Follow-up assessment;Breast/nipple pain;Difficult latch Mom using nipple shield to latch. LC noted Mom not obtaining good depth w/nipple shield. Assisted with positioning for baby to obtain depth, baby had wider gape and not tongue thrusting as much. Baby demonstrated a good rhythmic suck with some swallows noted, small amount of colostrum visible in the nipple shield #20. Mom denied any discomfort with nursing. Mom also has anemia Hgb 7.0, set up DEBP for Mom to post pump for 15 minutes on preemie setting to encourage milk production and to have EBM to supplement if needed. Encouraged to monitor voids/stools. Encouraged to call for assist as needed with feedings. Advised baby should be at the breast 8-12 times in 24 hours and with feeding ques.   Maternal Data    Feeding Feeding Type: Breast Fed Length of feed: 25 min  LATCH Score/Interventions Latch: Grasps breast easily, tongue down, lips flanged, rhythmical sucking. (using #20 nipple shield)  Audible Swallowing: A few with stimulation  Type of Nipple: Everted at rest and after stimulation  Comfort (Breast/Nipple): Filling, red/small blisters or bruises, mild/mod discomfort  Problem noted: Mild/Moderate discomfort Interventions (Mild/moderate discomfort): Hand massage;Hand expression (EBM to sore nipples/nipple shield)  Hold (Positioning): Assistance needed to correctly position infant at breast and maintain latch. Intervention(s): Breastfeeding basics reviewed;Support Pillows;Position options;Skin to skin  LATCH Score: 7  Lactation Tools Discussed/Used Tools: Nipple [place] Fuel;Pump Nipple shield size: 20 Breast pump type: Double-Electric Breast Pump Initiated by:: KG Date initiated:: 10/15/14   Consult Status Consult Status: Follow-up Date: 10/16/14 Follow-up  type: In-patient    Katrine Coho 10/15/2014, 3:19 PM

## 2014-10-16 ENCOUNTER — Ambulatory Visit: Payer: Self-pay

## 2014-10-16 ENCOUNTER — Encounter (HOSPITAL_COMMUNITY): Payer: Self-pay

## 2014-10-16 LAB — CBC WITH DIFFERENTIAL/PLATELET
Basophils Absolute: 0.1 10*3/uL (ref 0.0–0.1)
Basophils Relative: 1 % (ref 0–1)
Eosinophils Absolute: 0.4 10*3/uL (ref 0.0–0.7)
Eosinophils Relative: 4 % (ref 0–5)
HCT: 22.2 % — ABNORMAL LOW (ref 36.0–46.0)
Hemoglobin: 7.3 g/dL — ABNORMAL LOW (ref 12.0–15.0)
Lymphocytes Relative: 21 % (ref 12–46)
Lymphs Abs: 2.5 10*3/uL (ref 0.7–4.0)
MCH: 27.9 pg (ref 26.0–34.0)
MCHC: 32.9 g/dL (ref 30.0–36.0)
MCV: 84.7 fL (ref 78.0–100.0)
Monocytes Absolute: 0.8 10*3/uL (ref 0.1–1.0)
Monocytes Relative: 7 % (ref 3–12)
Neutro Abs: 8 10*3/uL — ABNORMAL HIGH (ref 1.7–7.7)
Neutrophils Relative %: 67 % (ref 43–77)
Platelets: 190 10*3/uL (ref 150–400)
RBC: 2.62 MIL/uL — ABNORMAL LOW (ref 3.87–5.11)
RDW: 14.5 % (ref 11.5–15.5)
WBC: 11.7 10*3/uL — ABNORMAL HIGH (ref 4.0–10.5)

## 2014-10-16 MED ORDER — WITCH HAZEL-GLYCERIN EX PADS: 1.0000 "application " | MEDICATED_PAD | CUTANEOUS | Status: DC | PRN

## 2014-10-16 MED ORDER — OXYCODONE-ACETAMINOPHEN 5-325 MG PO TABS: 2.0000 | ORAL_TABLET | ORAL | Status: DC | PRN

## 2014-10-16 MED ORDER — IBUPROFEN 600 MG PO TABS: 600.0000 mg | ORAL_TABLET | Freq: Four times a day (QID) | ORAL | Status: DC

## 2014-10-16 MED ORDER — FERROUS SULFATE 325 (65 FE) MG PO TABS: 325.0000 mg | ORAL_TABLET | Freq: Three times a day (TID) | ORAL | Status: DC

## 2014-10-16 MED ORDER — BENZOCAINE-MENTHOL 20-0.5 % EX AERO: 1.0000 "application " | INHALATION_SPRAY | CUTANEOUS | Status: DC | PRN

## 2014-10-16 MED ORDER — DIBUCAINE 1 % RE OINT: 1.0000 "application " | TOPICAL_OINTMENT | RECTAL | Status: DC | PRN

## 2014-10-16 NOTE — Lactation Note (Signed)
This note was copied from the chart of Debra Kassadie Pancake. Lactation Consultation Note; Mothers breast are filling. Assist with instructions in properly applying #20 nipple shield. Advised mother to pre-pump for several mins. to firm nipple. Mother was advised in breastfeeding infant 8-12 times in 24 hours and with feeding cues. Mother advised to follow up with [facility] services for outpatient visit. Mother states she will phone as needed. Advised mother in treatment of severe engorgment. Mother receptive to all teaching.   Patient Name: Debra Gibbs WIOMB'T Date: 10/16/2014 Reason for consult: Follow-up assessment   Maternal Data    Feeding Feeding Type: Breast Fed Length of feed: 20 min  LATCH Score/Interventions Latch: Grasps breast easily, tongue down, lips flanged, rhythmical sucking.  Audible Swallowing: Spontaneous and intermittent Intervention(s): Hand expression Intervention(s): Skin to skin;Hand expression  Type of Nipple: Everted at rest and after stimulation Intervention(s): Hand pump;Double electric pump  Comfort (Breast/Nipple): Filling, red/small blisters or bruises, mild/mod discomfort  Problem noted: Filling Interventions (Mild/moderate discomfort): Hand expression;Pre-pump if needed  Hold (Positioning): Assistance needed to correctly position infant at breast and maintain latch.  LATCH Score: 8  Lactation Tools Discussed/Used Tools: Nipple Shields Nipple shield size: 20   Consult Status Consult Status: Complete    Darla Lesches 10/16/2014, 12:09 PM

## 2014-10-16 NOTE — Discharge Summary (Signed)
Obstetric Discharge Summary Reason for Admission: induction of labor Prenatal Procedures: none Intrapartum Procedures: spontaneous vaginal delivery Postpartum Procedures: none Complications-Operative and Postpartum: none HEMOGLOBIN  Date Value Ref Range Status  10/16/2014 7.3* 12.0 - 15.0 g/dL Final  07/27/2012 12.8 12.2 - 16.2 g/dL Final   HCT  Date Value Ref Range Status  10/16/2014 22.2* 36.0 - 46.0 % Final   HCT, POC  Date Value Ref Range Status  07/27/2012 39.7 37.7 - 47.9 % Final    Physical Exam:  General: alert, cooperative and no distress Lochia: appropriate Uterine Fundus: firm Incision: N/A DVT Evaluation: No evidence of DVT seen on physical exam. Negative Homan's sign. No cords or calf tenderness. No significant calf/ankle edema.  Discharge Diagnoses: Term Pregnancy-delivered  Discharge Information: Date: 10/16/2014 Activity: pelvic rest Diet: routine Medications: Ibuprofen, Colace, Iron and Percocet Condition: stable Instructions: refer to practice specific booklet Discharge to: home   Newborn Data: Live born female  Birth Weight: 7 lb 10.8 oz (3481 g) APGAR: 9, 9  Home with mother.  Rachelle A Denney 10/16/2014, 8:31 AM

## 2014-10-28 ENCOUNTER — Encounter: Payer: Self-pay | Admitting: Obstetrics

## 2014-10-28 ENCOUNTER — Ambulatory Visit (INDEPENDENT_AMBULATORY_CARE_PROVIDER_SITE_OTHER): Payer: Managed Care, Other (non HMO) | Admitting: Obstetrics

## 2014-10-28 DIAGNOSIS — D508 Other iron deficiency anemias: Secondary | ICD-10-CM

## 2014-10-28 DIAGNOSIS — M5432 Sciatica, left side: Secondary | ICD-10-CM

## 2014-10-28 MED ORDER — FERRALET 90 90-1 MG PO TABS: 1.0000 | ORAL_TABLET | Freq: Every morning | ORAL | Status: DC

## 2014-10-28 MED ORDER — OXYCODONE HCL 10 MG PO TABS: 10.0000 mg | ORAL_TABLET | Freq: Four times a day (QID) | ORAL | Status: DC | PRN

## 2014-10-28 NOTE — Progress Notes (Signed)
Subjective:     Debra Gibbs is a 34 y.o. female who presents for a postpartum visit. She is 2 weeks postpartum following a spontaneous vaginal delivery. I have fully reviewed the prenatal and intrapartum course. The delivery was at 64 gestational weeks. Outcome: spontaneous vaginal delivery. Anesthesia: epidural. Postpartum course has been normal. Baby's course has been normal. Baby is feeding by both breast and bottle - Enfamil newborn. Bleeding thin lochia. Bowel function is mild constipation. Bladder function is normal. Patient is not sexually active. Contraception method is abstinence. Postpartum depression screening: negative.  Tobacco, alcohol and substance abuse history reviewed.  Adult immunizations reviewed including TDAP, rubella and varicella.  The following portions of the patient's history were reviewed and updated as appropriate: allergies, current medications, past family history, past medical history, past social history, past surgical history and problem list.  Review of Systems A comprehensive review of systems was negative.   Objective:    BP 119/76 mmHg  Pulse 78  Temp(Src) 98 F (36.7 C)  Wt 122 lb (55.339 kg)  General:  alert and no distress   Breasts:  inspection negative, no nipple discharge or bleeding, no masses or nodularity palpable  Lungs: clear to auscultation bilaterally  Heart:  regular rate and rhythm, S1, S2 normal, no murmur, click, rub or gallop  Abdomen: normal findings: soft, non-tender     Assessment:     Normal postpartum exam. Pap smear not done at today's visit.   Anemia Plan:    1. Contraception: cosidering options 2. Continue Iron.  Ferralet 90 dispensed. 3. Follow up in: 4 weeks or as needed.   Healthy lifestyle practices reviewed

## 2014-11-12 ENCOUNTER — Telehealth: Payer: Self-pay | Admitting: *Deleted

## 2014-11-12 ENCOUNTER — Telehealth: Payer: Self-pay

## 2014-11-12 ENCOUNTER — Other Ambulatory Visit: Payer: Self-pay | Admitting: Obstetrics

## 2014-11-12 DIAGNOSIS — M5432 Sciatica, left side: Secondary | ICD-10-CM

## 2014-11-12 MED ORDER — OXYCODONE HCL 10 MG PO TABS: 10.0000 mg | ORAL_TABLET | Freq: Four times a day (QID) | ORAL | Status: DC | PRN

## 2014-11-12 NOTE — Telephone Encounter (Signed)
Patient is requesting a refill of her pain medication. 1:45 Per  Dr Jodi Mourning- can not keep treating pain now that she is not pregnant- she needs to follow up with ortho or neuro. LM on VM to that affect and that patient should call us if she needs help with referral.  1:48 Patient called back- she said she and Dr Jodi Mourning did discuss a referral back to ortho after her 6 week post partum.  Call forwarded to Dr Jodi Mourning for him to decide if he is going to refill.

## 2014-11-12 NOTE — Telephone Encounter (Signed)
Patient was called about appt with Raliegh Ip , Dr. Almedia Balls, on 11/18/14 at [facility]

## 2014-11-12 NOTE — Telephone Encounter (Signed)
When patient came to pick up prescription, we also taped a note on it regarding her appt with Dr. Almedia Balls at [facility] on 11/18/14 at [facility] - she was called as well.

## 2014-11-26 ENCOUNTER — Encounter: Payer: Self-pay | Admitting: Obstetrics

## 2014-11-26 ENCOUNTER — Ambulatory Visit (INDEPENDENT_AMBULATORY_CARE_PROVIDER_SITE_OTHER): Payer: Managed Care, Other (non HMO) | Admitting: Obstetrics

## 2014-11-26 DIAGNOSIS — Z30014 Encounter for initial prescription of intrauterine contraceptive device: Secondary | ICD-10-CM

## 2014-11-26 NOTE — Progress Notes (Signed)
Subjective:     Debra Gibbs is a 34 y.o. female who presents for a postpartum visit. She is 6 weeks postpartum following a spontaneous vaginal delivery. I have fully reviewed the prenatal and intrapartum course. The delivery was at 27 gestational weeks. Outcome: spontaneous vaginal delivery. Anesthesia: epidural. Postpartum course has been normal. Baby's course has been normal. Baby is feeding by bottle - Similac Advance. Bleeding no bleeding. Bowel function is normal. Bladder function is normal. Patient is not sexually active. Contraception method is abstinence. Postpartum depression screening: negative.  Tobacco, alcohol and substance abuse history reviewed.  Adult immunizations reviewed including TDAP, rubella and varicella.  The following portions of the patient's history were reviewed and updated as appropriate: allergies, current medications, past family history, past medical history, past social history, past surgical history and problem list.  Review of Systems A comprehensive review of systems was negative.   Objective:    BP 102/77 mmHg  Pulse 93  Wt 118 lb (53.524 kg)  General:  alert and no distress   Breasts:  inspection negative, no nipple discharge or bleeding, no masses or nodularity palpable  Lungs: clear to auscultation bilaterally  Heart:  regular rate and rhythm, S1, S2 normal, no murmur, click, rub or gallop  Abdomen: normal findings: soft, non-tender   Vulva:  normal  Vagina: normal vagina  Cervix:  no cervical motion tenderness  Corpus: normal size, contour, position, consistency, mobility, non-tender  Adnexa:  no mass, fullness, tenderness  Rectal Exam: Not performed.           Assessment:     Normal postpartum exam. Pap smear not done at today's visit.  Plan:    1. Contraception: IUD 2. Mirena IUD 3. Follow up in: several weeks for Mirena IUD insertion   Healthy lifestyle practices reviewed

## 2014-11-27 ENCOUNTER — Encounter: Payer: Self-pay | Admitting: Obstetrics

## 2014-11-27 ENCOUNTER — Encounter: Payer: Self-pay | Admitting: *Deleted

## 2014-11-27 ENCOUNTER — Telehealth: Payer: Self-pay | Admitting: *Deleted

## 2014-11-27 NOTE — Telephone Encounter (Signed)
Patient called requesting return to work letter.  Letter typed and faxed to number provided by patient.

## 2014-12-08 ENCOUNTER — Telehealth: Payer: Self-pay | Admitting: *Deleted

## 2014-12-08 NOTE — Telephone Encounter (Signed)
Patient is interested in a Mirena IUD for contraception for birth control. Patient has started her cycle.  Attempted to contact the patient and left message for patient to call the office.

## 2014-12-08 NOTE — Telephone Encounter (Signed)
Patient contacted the office @ 11:51 am.  Attempted to contact the patient @ 2:00 pm and left message for patient to call the office.

## 2014-12-10 NOTE — Telephone Encounter (Signed)
Patient returned called. Attempted to call patient and left message for patient to call the office.

## 2014-12-17 ENCOUNTER — Other Ambulatory Visit: Payer: Self-pay | Admitting: Orthopedic Surgery

## 2014-12-17 DIAGNOSIS — M7918 Myalgia, other site: Secondary | ICD-10-CM

## 2014-12-17 DIAGNOSIS — M79604 Pain in right leg: Secondary | ICD-10-CM

## 2014-12-18 NOTE — Telephone Encounter (Signed)
Returned patient's call and advised her to contact the office with the first day of her next cycle. Patient advised to not to have any unprotected intercourse., Patient verbalized understanding.

## 2014-12-31 ENCOUNTER — Inpatient Hospital Stay: Admission: RE | Admit: 2014-12-31 | Payer: Managed Care, Other (non HMO) | Source: Ambulatory Visit

## 2015-01-06 ENCOUNTER — Ambulatory Visit: Payer: Managed Care, Other (non HMO)

## 2015-01-06 ENCOUNTER — Ambulatory Visit (INDEPENDENT_AMBULATORY_CARE_PROVIDER_SITE_OTHER): Payer: Managed Care, Other (non HMO) | Admitting: Certified Nurse Midwife

## 2015-01-06 VITALS — BP 103/78 | HR 96 | Wt 116.0 lb

## 2015-01-06 DIAGNOSIS — Z3202 Encounter for pregnancy test, result negative: Secondary | ICD-10-CM | POA: Diagnosis not present

## 2015-01-06 DIAGNOSIS — Z30430 Encounter for insertion of intrauterine contraceptive device: Secondary | ICD-10-CM | POA: Diagnosis not present

## 2015-01-06 DIAGNOSIS — D500 Iron deficiency anemia secondary to blood loss (chronic): Secondary | ICD-10-CM

## 2015-01-06 LAB — CBC WITH DIFFERENTIAL/PLATELET
Basophils Absolute: 0.1 10*3/uL (ref 0.0–0.1)
Basophils Relative: 1 % (ref 0–1)
Eosinophils Absolute: 0.3 10*3/uL (ref 0.0–0.7)
Eosinophils Relative: 6 % — ABNORMAL HIGH (ref 0–5)
HCT: 36.4 % (ref 36.0–46.0)
Hemoglobin: 12 g/dL (ref 12.0–15.0)
Lymphocytes Relative: 41 % (ref 12–46)
Lymphs Abs: 2.4 10*3/uL (ref 0.7–4.0)
MCH: 27 pg (ref 26.0–34.0)
MCHC: 33 g/dL (ref 30.0–36.0)
MCV: 81.8 fL (ref 78.0–100.0)
MPV: 10.5 fL (ref 8.6–12.4)
Monocytes Absolute: 0.5 10*3/uL (ref 0.1–1.0)
Monocytes Relative: 8 % (ref 3–12)
Neutro Abs: 2.6 10*3/uL (ref 1.7–7.7)
Neutrophils Relative %: 44 % (ref 43–77)
Platelets: 294 10*3/uL (ref 150–400)
RBC: 4.45 MIL/uL (ref 3.87–5.11)
RDW: 17.6 % — ABNORMAL HIGH (ref 11.5–15.5)
WBC: 5.8 10*3/uL (ref 4.0–10.5)

## 2015-01-06 LAB — POCT URINE PREGNANCY: Preg Test, Ur: NEGATIVE

## 2015-01-06 NOTE — Progress Notes (Signed)
Patient desired to have a CBC performed to check on her anemia.  Hx of anemia after delivery.  CBC ordered.  Ferrolet samples and PNV samples given to patient.

## 2015-01-06 NOTE — Progress Notes (Signed)
Patient ID: Debra Gibbs, female   DOB: 1980-07-30, 34 y.o.   MRN: [id number]  IUD Procedure Note   DIAGNOSIS: Desires long-term, reversible contraception   PROCEDURE: IUD placement Performing Provider: Kandis Cocking CNM  Patient counseled prior to procedure. I explained risks and benefits of Mirena IUD, reviewed alternative forms of contraception. Patient stated understanding and consented to continue with procedure.   LMP: 01/01/15 Pregnancy Test: Negative Lot #: [id number] Expiration Date: 06/18   IUD type: [ X ] Mirena   [   ] Paraguard    PROCEDURE:  Timeout procedure was performed to ensure right patient and right site.  A bimanual exam was performed to determine the position of the uterus, retroverted. The speculum was placed. The vagina and cervix was sterilized in the usual manner and sterile technique was maintained throughout the course of the procedure. A single toothed tenaculum was not required. The depth of the uterus was sounded to 9 cm. The IUD was inserted to the appropriate depth and inserted without difficulty.  The string was cut to an estimated 4 cm length. Bleeding was minimal. The patient tolerated the procedure well.   Follow up: The patient tolerated the procedure well without complications.  Standard post-procedure care is explained and return precautions are given.  Kandis Cocking, CNM

## 2015-01-28 ENCOUNTER — Ambulatory Visit
Admission: RE | Admit: 2015-01-28 | Discharge: 2015-01-28 | Disposition: A | Discharge status: Home / Self Care | Payer: Managed Care, Other (non HMO) | Source: Ambulatory Visit | Attending: Orthopedic Surgery | Admitting: Orthopedic Surgery

## 2015-01-28 DIAGNOSIS — M79604 Pain in right leg: Secondary | ICD-10-CM

## 2015-01-28 DIAGNOSIS — M7918 Myalgia, other site: Secondary | ICD-10-CM

## 2015-02-04 ENCOUNTER — Ambulatory Visit: Payer: Managed Care, Other (non HMO) | Admitting: Certified Nurse Midwife

## 2015-05-06 ENCOUNTER — Other Ambulatory Visit: Payer: Self-pay | Admitting: Orthopedic Surgery

## 2015-05-06 DIAGNOSIS — R102 Pelvic and perineal pain: Secondary | ICD-10-CM

## 2015-05-13 ENCOUNTER — Other Ambulatory Visit: Payer: Managed Care, Other (non HMO)

## 2015-05-17 ENCOUNTER — Telehealth: Payer: Self-pay | Admitting: *Deleted

## 2015-05-17 DIAGNOSIS — N390 Urinary tract infection, site not specified: Secondary | ICD-10-CM

## 2015-05-17 MED ORDER — SULFAMETHOXAZOLE-TRIMETHOPRIM 800-160 MG PO TABS: 1.0000 | ORAL_TABLET | Freq: Two times a day (BID) | ORAL | Status: DC

## 2015-05-17 NOTE — Telephone Encounter (Signed)
[facility] for [facility] UTI. She states the medication is making her nauseous and she can't keep it down. She is going to call back with the name of it so Dr Jodi Mourning can call in an alternative.

## 2015-05-17 NOTE — Telephone Encounter (Signed)
Patient was given Macrobid- she needs alternative. Bactrim sent to her pharmacy. Patient notified and Macrobid added to her allergy list.

## 2015-05-27 ENCOUNTER — Other Ambulatory Visit: Payer: Managed Care, Other (non HMO)

## 2015-06-04 ENCOUNTER — Ambulatory Visit
Admission: RE | Admit: 2015-06-04 | Discharge: 2015-06-04 | Disposition: A | Discharge status: Home / Self Care | Payer: Managed Care, Other (non HMO) | Source: Ambulatory Visit | Attending: Orthopedic Surgery | Admitting: Orthopedic Surgery

## 2015-06-04 DIAGNOSIS — R102 Pelvic and perineal pain: Secondary | ICD-10-CM

## 2015-06-07 ENCOUNTER — Encounter: Payer: Self-pay | Admitting: Internal Medicine

## 2015-07-02 ENCOUNTER — Encounter: Payer: Self-pay | Admitting: Family Medicine

## 2015-07-02 ENCOUNTER — Ambulatory Visit (INDEPENDENT_AMBULATORY_CARE_PROVIDER_SITE_OTHER): Payer: Managed Care, Other (non HMO) | Admitting: Family Medicine

## 2015-07-02 VITALS — BP 104/62 | HR 99 | Temp 98.1°F | Ht 62.0 in | Wt 118.6 lb

## 2015-07-02 DIAGNOSIS — J069 Acute upper respiratory infection, unspecified: Secondary | ICD-10-CM | POA: Diagnosis not present

## 2015-07-02 MED ORDER — PREDNISONE 10 MG PO TABS: ORAL_TABLET | ORAL | Status: DC

## 2015-07-02 MED ORDER — HYDROCOD POLST-CPM POLST ER 10-8 MG/5ML PO SUER: 5.0000 mL | Freq: Two times a day (BID) | ORAL | Status: DC | PRN

## 2015-07-02 NOTE — Progress Notes (Signed)
Patient ID: Debra Gibbs, female   DOB: December 21, 1980, 35 y.o.   MRN: [id number]  Tommi Rumps, MD Phone: [phone]  Debra Gibbs is a 35 y.o. female who presents today for same-day visit.  Patient notes onset of cough Tuesday. Notes nasal and sinus congestion with postnasal drip as well. No earache or fevers or body aches. She does note some mild lower rib soreness with cough. She had a single episode of posttussive emesis. Minimal shortness of breath with cough. No chest pain. Note she had bronchitis in February and had minimal lingering cough after several weeks. Daughters had been sick recently with similar symptoms to her today. Has been using Delsym for cough. Has responded well to prednisone in the past.  PMH: Former smoker   ROS see history of present illness  Objective  Physical Exam Filed Vitals:   07/02/15 1508  BP: 104/62  Pulse: 99  Temp: 98.1 F (36.7 C)    BP Readings from Last 3 Encounters:  07/02/15 104/62  01/06/15 103/78  11/26/14 102/77   Wt Readings from Last 3 Encounters:  07/02/15 118 lb 9.6 oz (53.797 kg)  01/06/15 116 lb (52.617 kg)  11/26/14 118 lb (53.524 kg)    Physical Exam  Constitutional: She is well-developed, well-nourished, and in no distress.  HENT:  Head: Normocephalic and atraumatic.  Right Ear: External ear normal.  Left Ear: External ear normal.  Mouth/Throat: Oropharynx is clear and moist. No oropharyngeal exudate.  Eyes: Conjunctivae are normal. Pupils are equal, round, and reactive to light.  Neck: Neck supple.  Cardiovascular: Normal rate, regular rhythm and normal heart sounds.  Exam reveals no gallop and no friction rub.   No murmur heard. Pulmonary/Chest: Effort normal and breath sounds normal. No respiratory distress. She has no wheezes. She has no rales.  Musculoskeletal:  Lower ribs mildly tender to palpation bilaterally  Lymphadenopathy:    She has no cervical adenopathy.  Neurological: She is alert. Gait  normal.  Skin: Skin is warm and dry. She is not diaphoretic.     Assessment/Plan: Please see individual problem list.  Viral upper respiratory illness Patient's symptoms most consistent with viral upper respiratory illness. Benign lung exam. Stable vital signs. Discussed likely viral nature. We will treat with a prednisone taper. Tussionex for cough. She'll continue to monitor. She's given return precautions.    No orders of the defined types were placed in this encounter.    Meds ordered this encounter  Medications  . predniSONE (DELTASONE) 10 MG tablet    Sig: Please take 60 mg (6 tablets) by mouth today, then decrease by one tablet daily until gone    Dispense:  21 tablet    Refill:  0  . chlorpheniramine-HYDROcodone (TUSSIONEX PENNKINETIC ER) 10-8 MG/5ML SUER    Sig: Take 5 mLs by mouth every 12 (twelve) hours as needed for cough.    Dispense:  140 mL    Refill:  0   Tommi Rumps, MD [place]

## 2015-07-02 NOTE — Progress Notes (Signed)
Pre visit review using our clinic review tool, if applicable. No additional management support is needed unless otherwise documented below in the visit note. 

## 2015-07-02 NOTE — Patient Instructions (Signed)
Nice to meet you. Your symptoms are likely related to a viral illness leading to an upper respiratory infection. Possibly a bronchitis as well. We will treat you with prednisone and Tussionex for cough. Please monitor for worsening symptoms. If you develop chest pain, shortness breath, cough productive of blood, fevers, or any new or changing symptoms please seek medical attention.

## 2015-07-02 NOTE — Assessment & Plan Note (Signed)
Patient's symptoms most consistent with viral upper respiratory illness. Benign lung exam. Stable vital signs. Discussed likely viral nature. We will treat with a prednisone taper. Tussionex for cough. She'll continue to monitor. She's given return precautions.

## 2015-09-22 ENCOUNTER — Encounter: Payer: Self-pay | Admitting: Internal Medicine

## 2015-09-22 ENCOUNTER — Ambulatory Visit (INDEPENDENT_AMBULATORY_CARE_PROVIDER_SITE_OTHER): Payer: Managed Care, Other (non HMO) | Admitting: Internal Medicine

## 2015-09-22 VITALS — BP 104/74 | HR 94 | Temp 98.8°F | Wt 117.8 lb

## 2015-09-22 DIAGNOSIS — J069 Acute upper respiratory infection, unspecified: Secondary | ICD-10-CM | POA: Diagnosis not present

## 2015-09-22 MED ORDER — AZITHROMYCIN 250 MG PO TABS: ORAL_TABLET | ORAL | Status: DC

## 2015-09-22 MED ORDER — HYDROCOD POLST-CPM POLST ER 10-8 MG/5ML PO SUER: 5.0000 mL | Freq: Two times a day (BID) | ORAL | Status: DC | PRN

## 2015-09-22 NOTE — Progress Notes (Addendum)
HPI  Pt presents to the clinic today with c/o facial pain and pressure, nasal congestion, cough and shortness of breath. This started 4-5 days ago. She is not blowing anything out of her nose. The cough is productive of yellow mucous. She has some associated shortness of breath. She denies fever, chills or body aches. She has tried Delsym without any relief. She has no history of allergies or breathing problems. She has not had sick contacts.  Review of Systems    Past Medical History  Diagnosis Date  . Anxiety   . Rheumatoid arthritis(714.0)   . Headache     with pregnancy only    Family History  Problem Relation Age of Onset  . Arthritis Father     Social History   Social History  . Marital Status: Married    Spouse Name: N/A  . Number of Children: 2  . Years of Education: N/A   Occupational History  . waitressing part-time, [employer] in [place]  . Smoking status: Former Smoker    Quit date: 04/10/2005  . Smokeless tobacco: Never Used  . Alcohol Use: No  . Drug Use: No  . Sexual Activity:    Partners: Male    Birth Control/ Protection: None   Other Topics Concern  . Not on file   Social History Narrative    Allergies  Allergen Reactions  . Citalopram Hydrobromide Other (See Comments)    REACTION: shakes and made her jaw tight  . Macrobid [Nitrofurantoin] Nausea And Vomiting     Constitutional: Denies headache, fatigue, fever or abrupt weight changes.  HEENT:  Positive facial pain, nasal congestion and sore throat. Denies eye redness, ear pain, ringing in the ears, wax buildup, runny nose or bloody nose. Respiratory: Positive cough. Denies difficulty breathing or shortness of breath.  Cardiovascular: Denies chest pain, chest tightness, palpitations or swelling in the hands or feet.   No other specific complaints in a complete review of systems (except as listed in HPI above).  Objective:  BP 104/74 mmHg  Pulse 94   Temp(Src) 98.8 F (37.1 C) (Oral)  Wt 117 lb 12 oz (53.411 kg)  SpO2 98%  General: Appears her stated age, ill appearing, in NAD. HEENT: Head: normal shape and size, no sinus tenderness noted; Eyes: sclera white, no icterus, conjunctiva pink; Ears: Tm's gray and intact, normal light reflex; Nose: mucosa boggy and moist, septum midline; Throat/Mouth: Teeth present, mucosa pink and moist, no exudate noted, no lesions or ulcerations noted.  Neck:  Cervical adenopathy, R>L.  Cardiovascular: Normal rate and rhythm. S1,S2 noted.  No murmur, rubs or gallops noted.  Pulmonary/Chest: Normal effort and positive vesicular breath sounds. No respiratory distress. No wheezes, rales or ronchi noted.      Assessment & Plan:   Upper respiratory infection:  Flonase 2 sprays each nostril for 3 days and then as needed. eRx for Azithromax x 5 days RX for Tussionex for cough   RTC as needed or if symptoms persist. Webb Silversmith, NP

## 2015-09-22 NOTE — Progress Notes (Signed)
Pre visit review using our clinic review tool, if applicable. No additional management support is needed unless otherwise documented below in the visit note. 

## 2015-09-22 NOTE — Patient Instructions (Signed)
Upper Respiratory Infection, Adult Most upper respiratory infections (URIs) are a viral infection of the air passages leading to the lungs. A URI affects the nose, throat, and upper air passages. The most common type of URI is nasopharyngitis and is typically referred to as "the common cold." URIs run their course and usually go away on their own. Most of the time, a URI does not require medical attention, but sometimes a bacterial infection in the upper airways can follow a viral infection. This is called a secondary infection. Sinus and middle ear infections are common types of secondary upper respiratory infections. Bacterial pneumonia can also complicate a URI. A URI can worsen asthma and chronic obstructive pulmonary disease (COPD). Sometimes, these complications can require emergency medical care and may be life threatening.  CAUSES Almost all URIs are caused by viruses. A virus is a type of germ and can spread from one person to another.  RISKS FACTORS You may be at risk for a URI if:   You smoke.   You have chronic heart or lung disease.  You have a weakened defense (immune) system.   You are very young or very old.   You have nasal allergies or asthma.  You work in crowded or poorly ventilated areas.  You work in health care facilities or schools. SIGNS AND SYMPTOMS  Symptoms typically develop 2-3 days after you come in contact with a cold virus. Most viral URIs last 7-10 days. However, viral URIs from the influenza virus (flu virus) can last 14-18 days and are typically more severe. Symptoms may include:   Runny or stuffy (congested) nose.   Sneezing.   Cough.   Sore throat.   Headache.   Fatigue.   Fever.   Loss of appetite.   Pain in your forehead, behind your eyes, and over your cheekbones (sinus pain).  Muscle aches.  DIAGNOSIS  Your health care provider may diagnose a URI by:  Physical exam.  Tests to check that your symptoms are not due to  another condition such as:  Strep throat.  Sinusitis.  Pneumonia.  Asthma. TREATMENT  A URI goes away on its own with time. It cannot be cured with medicines, but medicines may be prescribed or recommended to relieve symptoms. Medicines may help:  Reduce your fever.  Reduce your cough.  Relieve nasal congestion. HOME CARE INSTRUCTIONS   Take medicines only as directed by your health care provider.   Gargle warm saltwater or take cough drops to comfort your throat as directed by your health care provider.  Use a warm mist humidifier or inhale steam from a shower to increase air moisture. This may make it easier to breathe.  Drink enough fluid to keep your urine clear or pale yellow.   Eat soups and other clear broths and maintain good nutrition.   Rest as needed.   Return to work when your temperature has returned to normal or as your health care provider advises. You may need to stay home longer to avoid infecting others. You can also use a face mask and careful hand washing to prevent spread of the virus.  Increase the usage of your inhaler if you have asthma.   Do not use any tobacco products, including cigarettes, chewing tobacco, or electronic cigarettes. If you need help quitting, ask your health care provider. PREVENTION  The best way to protect yourself from getting a cold is to practice good hygiene.   Avoid oral or hand contact with people with cold   symptoms.   Wash your hands often if contact occurs.  There is no clear evidence that vitamin C, vitamin E, echinacea, or exercise reduces the chance of developing a cold. However, it is always recommended to get plenty of rest, exercise, and practice good nutrition.  SEEK MEDICAL CARE IF:   You are getting worse rather than better.   Your symptoms are not controlled by medicine.   You have chills.  You have worsening shortness of breath.  You have brown or red mucus.  You have yellow or brown nasal  discharge.  You have pain in your face, especially when you bend forward.  You have a fever.  You have swollen neck glands.  You have pain while swallowing.  You have white areas in the back of your throat. SEEK IMMEDIATE MEDICAL CARE IF:   You have severe or persistent:  Headache.  Ear pain.  Sinus pain.  Chest pain.  You have chronic lung disease and any of the following:  Wheezing.  Prolonged cough.  Coughing up blood.  A change in your usual mucus.  You have a stiff neck.  You have changes in your:  Vision.  Hearing.  Thinking.  Mood. MAKE SURE YOU:   Understand these instructions.  Will watch your condition.  Will get help right away if you are not doing well or get worse.   This information is not intended to replace advice given to you by your health care provider. Make sure you discuss any questions you have with your health care provider.   Document Released: 09/20/2000 Document Revised: 08/11/2014 Document Reviewed: 07/02/2013 Elsevier Interactive Patient Education 2016 Elsevier Inc.  

## 2015-12-09 ENCOUNTER — Encounter: Payer: Self-pay | Admitting: Neurology

## 2015-12-09 ENCOUNTER — Ambulatory Visit (INDEPENDENT_AMBULATORY_CARE_PROVIDER_SITE_OTHER): Payer: Managed Care, Other (non HMO) | Admitting: Neurology

## 2015-12-09 VITALS — BP 100/70 | HR 64 | Resp 14 | Ht 62.0 in | Wt 117.5 lb

## 2015-12-09 DIAGNOSIS — M069 Rheumatoid arthritis, unspecified: Secondary | ICD-10-CM

## 2015-12-09 DIAGNOSIS — M2550 Pain in unspecified joint: Secondary | ICD-10-CM | POA: Diagnosis not present

## 2015-12-09 DIAGNOSIS — G8929 Other chronic pain: Secondary | ICD-10-CM | POA: Diagnosis not present

## 2015-12-09 DIAGNOSIS — M5432 Sciatica, left side: Secondary | ICD-10-CM

## 2015-12-09 DIAGNOSIS — M545 Low back pain, unspecified: Secondary | ICD-10-CM | POA: Insufficient documentation

## 2015-12-09 MED ORDER — ETODOLAC 400 MG PO TABS: 400.0000 mg | ORAL_TABLET | Freq: Two times a day (BID) | ORAL | 5 refills | Status: DC

## 2015-12-09 NOTE — Progress Notes (Addendum)
[facility]  PATIENT: Debra Gibbs DOB: 12/25/1980  REFERRING DOCTOR OR PCP:  Dr. Lynann Bologna [phone]).   PCP is Dr. Silvio Pate SOURCE: Patient, notes from Dr. Lynann Bologna, MRI report and MRI images on PACS  _________________________________   HISTORICAL  CHIEF COMPLAINT:  Chief Complaint  Patient presents with  . Back Pain    Debra Gibbs is here today for eval of lbp radiating down bilat legs, onset during pregnancy in 2015, while she was getting up from a lying position.  Sts. pain is now worse at night.  Some relief with oral steroids.  She is currenlty taking Oxycodone and hs Gabapentin.  Hx. of RA, but has seen her rheumatologist and was told back/leg pain is not caused by her RA/fim    HISTORY OF PRESENT ILLNESS:  I had the pleasure seeing you patient, Debra Gibbs, at [facility] for neurologic consultation regarding her low back and bilateral leg pain.   She is a 35 year old woman who has had chronic back pain around 2015 during her second pregnancy.   She noted similar pain during the first pregnancy without pain resolved after she delivered. Currently pain is across the lower back (points to L4 L5 S1 region). Pain will radiate some into the legs but not all the way to the toes. In general, pain will be worse if she sits for long time, when she gets up from the floor, when she leans against a wall and when she is in bed and rolls over. No activity really always makes the pain better, though it is generally better when she is up and moving some..   She is currently on oxycodone and takes gabapentin at bedtime.  Oxycodone take scheduled for a while.    Steroids have helped for a short period time. Not currently on an NSAID and hasn't regularly taken them.  She was diagnosed with rheumatoid arthritis in 2006. She mostly has arthritic pains in the hands, feet and knees.   She was shortly on Morrie Sheldon but that was stopped for her last pregnancy.  In the  past, she was on Humira and before that was on Enbrel but had difficulties with shots.   At that time, she had joint pain which was improved by the medications but she was not experiencing any back pain.  RA was diagnosed with Dr. Jefm Bryant [facility]).   She used to see Dr. Ouida Sills (retired) and now sees Dr. Trudie Reed who, by report, did not feel there was enough arthropathy to go back on a disease modifying therapy.      I personally reviewed the MRI images from 01/28/2015 and agree with the official interpretation that they are normal.   The pelvic MRI 06/04/2015 did not show any lumbosacral or other abnormality. Hips were felt to be normal.   Notes from Dr. Lynann Bologna were also reviewed.     REVIEW OF SYSTEMS:    Constitutional: No fevers, chills, sweats, or change in appetite Eyes: No visual changes, double vision, eye pain Ear, nose and throat: No hearing loss, ear pain, nasal congestion, sore throat Cardiovascular: No chest pain, palpitations Respiratory: No shortness of breath at rest or with exertion.   No wheezes GastrointestinaI: No nausea, vomiting, diarrhea, abdominal pain, fecal incontinence Genitourinary: No dysuria, urinary retention or frequency.  No nocturia. Musculoskeletal:as above Integumentary: No rash, pruritus, skin lesions Neurological: as above Psychiatric: No depression at this time.  No anxiety Endocrine: No palpitations, diaphoresis, change in appetite, change in weigh or increased thirst Hematologic/Lymphatic:  No anemia, purpura, petechiae. Allergic/Immunologic: No itchy/runny eyes, nasal congestion, recent allergic reactions, rashes  ALLERGIES: Allergies  Allergen Reactions  . Citalopram Hydrobromide Other (See Comments)    REACTION: shakes and made her jaw tight  . Macrobid [Nitrofurantoin] Nausea And Vomiting    HOME MEDICATIONS:  Current Outpatient Prescriptions:  Marland Kitchen  Multiple Vitamin (MULTIVITAMIN) tablet, Take 1 tablet by mouth daily., Disp: , Rfl:  .   etodolac (LODINE) 400 MG tablet, Take 1 tablet (400 mg total) by mouth 2 (two) times daily., Disp: 60 tablet, Rfl: 5 .  gabapentin (NEURONTIN) 300 MG capsule, , Disp: , Rfl:  .  oxyCODONE-acetaminophen (PERCOCET) 7.5-325 MG tablet, , Disp: , Rfl:   PAST MEDICAL HISTORY: Past Medical History:  Diagnosis Date  . Anxiety   . Headache    with pregnancy only  . Rheumatoid arthritis(714.0)   . Vision abnormalities     PAST SURGICAL HISTORY: Past Surgical History:  Procedure Laterality Date  . WISDOM TOOTH EXTRACTION      FAMILY HISTORY: Family History  Problem Relation Age of Onset  . Lupus Mother   . Arthritis Father     SOCIAL HISTORY:  Social History   Social History  . Marital status: Married    Spouse name: N/A  . Number of children: 2  . Years of education: N/A   Occupational History  . waitressing part-time, [employer] in [place]  . Smoking status: Former Smoker    Quit date: 04/10/2005  . Smokeless tobacco: Never Used  . Alcohol use No  . Drug use: No  . Sexual activity: Not Currently    Partners: Male    Birth control/ protection: None   Other Topics Concern  . Not on file   Social History Narrative  . No narrative on file     PHYSICAL EXAM  Vitals:   12/09/15 1012  BP: 100/70  Pulse: 64  Resp: 14  Weight: 117 lb 8 oz (53.3 kg)  Height: 5\' 2"  (1.575 m)    Body mass index is 21.49 kg/m.   General: The patient is well-developed and well-nourished and in no acute distress  Eyes:  Funduscopic exam shows normal optic discs and retinal vessels.  Neck: The neck is supple, no carotid bruits are noted.  The neck is nontender.  Cardiovascular: The heart has a regular rate and rhythm with a normal S1 and S2. There were no murmurs, gallops or rubs. Lungs are clear to auscultation.  Skin: Extremities are without significant edema.  Musculoskeletal:  Back is markedly tender over the left piriformis and mildly tender  over the left trochanteric bursa, right piriformis muscle, both SI joints and lower lumbar paraspinal muscles.    Neurologic Exam  Mental status: The patient is alert and oriented x 3 at the time of the examination. The patient has apparent normal recent and remote memory, with an apparently normal attention span and concentration ability.   Speech is normal.  Cranial nerves: Extraocular movements are full. Pupils are equal, round, and reactive to light and accomodation.   There is good facial sensation to soft touch bilaterally.Facial strength is normal.  Trapezius and sternocleidomastoid strength is normal. No dysarthria is noted.  The tongue is midline, and the patient has symmetric elevation of the soft palate. No obvious hearing deficits are noted.  Motor:  Muscle bulk is normal.   Tone is normal. Strength is  5 / 5 in all 4 extremities.  Sensory: Sensory testing is intact to pinprick, soft touch and vibration sensation in all 4 extremities.  Coordination: Cerebellar testing reveals good finger-nose-finger and heel-to-shin bilaterally.  Gait and station: Station is normal.   Gait is normal. Tandem gait is normal. Romberg is negative.   Reflexes: Deep tendon reflexes are symmetric and normal bilaterally.   Plantar responses are flexor.    DIAGNOSTIC DATA (LABS, IMAGING, TESTING) - I reviewed patient records, labs, notes, testing and imaging myself where available.  Lab Results  Component Value Date   WBC 5.8 01/06/2015   HGB 12.0 01/06/2015   HCT 36.4 01/06/2015   MCV 81.8 01/06/2015   PLT 294 01/06/2015      Component Value Date/Time   NA 139 04/19/2012 0813   K 3.7 04/19/2012 0813   CL 105 04/19/2012 0813   CO2 27 04/19/2012 0813   GLUCOSE 92 04/19/2012 0813   BUN 10 04/19/2012 0813   CREATININE 0.7 04/19/2012 0813   CALCIUM 9.1 04/19/2012 0813   PROT 7.3 04/19/2012 0813   ALBUMIN 4.2 04/19/2012 0813   AST 23 04/19/2012 0813   ALT 26 04/19/2012 0813   ALKPHOS 55  04/19/2012 0813   BILITOT 0.8 04/19/2012 0813   GFRNONAA 91.51 03/01/2009 1038   GFRAA 112 02/04/2008 0953   Lab Results  Component Value Date   CHOL 208 (H) 04/19/2012   HDL 72 (A) 06/08/2014   LDLCALC 235 06/08/2014   LDLDIRECT 123.7 04/19/2012   TRIG 35.0 04/19/2012   CHOLHDL 4 04/19/2012   No results found for: HGBA1C No results found for: VITAMINB12 Lab Results  Component Value Date   TSH 0.559 04/15/2014       ASSESSMENT AND PLAN  Rheumatoid arthritis involving multiple sites, unspecified rheumatoid factor presence ([facility] [place]) - Plan: Rheumatoid factor, Sedimentation rate, ANA w/Reflex  Multiple joint pain - Plan: Rheumatoid factor, Sedimentation rate, ANA w/Reflex  Sciatica of left side  Chronic lower back pain   In summary, Debra Gibbs is a 35 year old woman with a two-year history of chronic back pain in the setting of a of seronegative rheumatoid arthritis..    On examination, she is very tender over the left piriformis muscle and mildly tender over the left trochanteric bursa, lower lumbar paraspinals and right piriformis muscle. At least some of her pain seems to be generated in that muscle so I proceeded to do a left piriformis muscle injection with 60 mg of Depo-Medrol and Marcaine. She dislikes getting injections and did not want to have the right muscle injected. She tolerated the procedure well and there were no complications.     I also instructed her to do piriformis muscle stretch exercises (she can see how to do this on YouTube).    I placed her on etodolac 400 mg by mouth twice a day and we will check rheumatoid/vasculitis labs.    Neurologic exam was normal and her history is not really consistent with any demyelinating events.   If she is not better in several weeks, I would consider starting duloxetine 60 mg daily.     Based on the lab results and her response to more conservative therapy, targeted RA therapy could also be considered.  She will return to see  me in 2 months or sooner if there are new or worsening neurologic symptoms and call if she is not better in a few weeks.  Thank you for asking me to see Debra Gibbs for a neurologic consultation. Please let me know if I can be  of further assistance with her or other patients in the future.   Debra Gibbs A. Felecia Shelling, MD, PhD [id number], 0000000 AM Certified in Neurology, Clinical Neurophysiology, Sleep Medicine, Pain Medicine and Neuroimaging  [facility] [address] [place], [place] [address] [phone]

## 2015-12-09 NOTE — Patient Instructions (Signed)
Try to do piriformis stretch exercises. You can check you www.youtube.com for examples of people doing them.  Etodolac is an anti-inflammatory that you should take twice a day. To reduce any possibility of GI upset, take with breakfast and dinner.

## 2015-12-10 LAB — ANA W/REFLEX: Anti Nuclear Antibody(ANA): NEGATIVE

## 2015-12-10 LAB — SEDIMENTATION RATE: Sed Rate: 2 mm/hr (ref 0–32)

## 2015-12-10 LAB — RHEUMATOID FACTOR: Rhuematoid fact SerPl-aCnc: <10 IU/mL (ref 0.0–13.9)

## 2015-12-14 ENCOUNTER — Telehealth: Payer: Self-pay | Admitting: *Deleted

## 2015-12-14 NOTE — Telephone Encounter (Signed)
-----   Message from Britt Bottom, MD sent at 12/10/2015  2:14 PM EDT ----- Please let her know that the rheumatoid arthritis and lupus lab work looked normal

## 2015-12-14 NOTE — Telephone Encounter (Signed)
LMOM (identified vm) that per RAS. RA and Lupus labwork was normal.  She does not need to return this call unless she has questions/fim

## 2015-12-21 ENCOUNTER — Ambulatory Visit (INDEPENDENT_AMBULATORY_CARE_PROVIDER_SITE_OTHER): Payer: Managed Care, Other (non HMO) | Admitting: Primary Care

## 2015-12-21 ENCOUNTER — Encounter: Payer: Self-pay | Admitting: Primary Care

## 2015-12-21 VITALS — BP 108/62 | HR 87 | Temp 98.4°F | Ht 62.0 in | Wt 113.8 lb

## 2015-12-21 DIAGNOSIS — R05 Cough: Secondary | ICD-10-CM

## 2015-12-21 DIAGNOSIS — R059 Cough, unspecified: Secondary | ICD-10-CM

## 2015-12-21 MED ORDER — HYDROCOD POLST-CPM POLST ER 10-8 MG/5ML PO SUER: 5.0000 mL | Freq: Two times a day (BID) | ORAL | 0 refills | Status: DC | PRN

## 2015-12-21 MED ORDER — AZITHROMYCIN 250 MG PO TABS: ORAL_TABLET | ORAL | 0 refills | Status: DC

## 2015-12-21 NOTE — Progress Notes (Signed)
Subjective:    Patient ID: Debra Gibbs, female    DOB: December 03, 1980, 35 y.o.   MRN: [id number]  HPI  Debra Gibbs is a 35 year old female who presents today with a chief complaint of cough. She also reports nasal and chest congestion, sore throat, chills, vomiting due to cough. Her cough is non productive. Her cough has been present for the past 8 days. She's taking Delsym cough syrup without improvement. Denies fevers. She's been around her daughter who has the same symptoms.   Review of Systems  Constitutional: Positive for chills and fatigue. Negative for fever.  HENT: Positive for congestion and sore throat. Negative for ear pain.   Respiratory: Positive for cough. Negative for shortness of breath and wheezing.        Past Medical History:  Diagnosis Date  . Anxiety   . Headache    with pregnancy only  . Rheumatoid arthritis(714.0)   . Vision abnormalities      Social History   Social History  . Marital status: Married    Spouse name: N/A  . Number of children: 2  . Years of education: N/A   Occupational History  . waitressing part-time, [employer] in [place]  . Smoking status: Former Smoker    Quit date: 04/10/2005  . Smokeless tobacco: Never Used  . Alcohol use No  . Drug use: No  . Sexual activity: Not Currently    Partners: Male    Birth control/ protection: None   Other Topics Concern  . Not on file   Social History Narrative  . No narrative on file    Past Surgical History:  Procedure Laterality Date  . WISDOM TOOTH EXTRACTION      Family History  Problem Relation Age of Onset  . Lupus Mother   . Arthritis Father     Allergies  Allergen Reactions  . Citalopram Hydrobromide Other (See Comments)    REACTION: shakes and made her jaw tight  . Macrobid [Nitrofurantoin] Nausea And Vomiting    Current Outpatient Prescriptions on File Prior to Visit  Medication Sig Dispense Refill  . etodolac (LODINE) 400 MG tablet  Take 1 tablet (400 mg total) by mouth 2 (two) times daily. 60 tablet 5  . Multiple Vitamin (MULTIVITAMIN) tablet Take 1 tablet by mouth daily.    Marland Kitchen oxyCODONE-acetaminophen (PERCOCET) 7.5-325 MG tablet      No current facility-administered medications on file prior to visit.     BP 108/62   Pulse 87   Temp 98.4 F (36.9 C) (Oral)   Ht 5\' 2"  (1.575 m)   Wt 113 lb 12.8 oz (51.6 kg)   LMP 12/14/2015   SpO2 97%   BMI 20.81 kg/m    Objective:   Physical Exam  Constitutional: She appears well-nourished. She appears ill.  HENT:  Right Ear: Tympanic membrane and ear canal normal.  Left Ear: Tympanic membrane and ear canal normal.  Nose: Mucosal edema present. Right sinus exhibits no maxillary sinus tenderness and no frontal sinus tenderness. Left sinus exhibits no maxillary sinus tenderness and no frontal sinus tenderness.  Mouth/Throat: Posterior oropharyngeal erythema present.  Eyes: Conjunctivae are normal.  Neck: Neck supple.  Cardiovascular: Normal rate and regular rhythm.   Pulmonary/Chest: Effort normal and breath sounds normal. She has no wheezes. She has no rales.  Lymphadenopathy:    She has no cervical adenopathy.  Skin: Skin is warm and dry.  Assessment & Plan:  URI:  Cough, congestion, fevers, fatigue x 8 days. Feeling worse since Friday last week. No improvement with OTC treatment. Exam with clear lungs, however, does appear ill.  Given duration of symptoms with presence of persistent cough, will treat. Rx for Zpak and Tussionex provided. Fluids, rest, follow up PRN.  Sheral Flow, NP

## 2015-12-21 NOTE — Patient Instructions (Signed)
Start Azithromycin antibiotics. Take 2 tablets by mouth today, then 1 tablet daily for 4 additional days.  You may take the Tussionex cough suppressant twice daily as needed for cough and rest. Caution this medication contains codeine and will make you feel drowsy.  Ensure you are staying hydrated with water.   It was a pleasure meeting you!

## 2015-12-21 NOTE — Progress Notes (Signed)
Pre visit review using our clinic review tool, if applicable. No additional management support is needed unless otherwise documented below in the visit note. 

## 2015-12-29 ENCOUNTER — Encounter: Payer: Self-pay | Admitting: Primary Care

## 2015-12-30 ENCOUNTER — Other Ambulatory Visit: Payer: Self-pay | Admitting: Primary Care

## 2015-12-30 DIAGNOSIS — R05 Cough: Secondary | ICD-10-CM

## 2015-12-30 DIAGNOSIS — R059 Cough, unspecified: Secondary | ICD-10-CM

## 2015-12-30 MED ORDER — HYDROCOD POLST-CPM POLST ER 10-8 MG/5ML PO SUER
5.0000 mL | Freq: Two times a day (BID) | ORAL | 0 refills | Status: DC | PRN
Start: 2015-12-30 — End: 2017-12-11

## 2016-01-03 ENCOUNTER — Encounter: Payer: Self-pay | Admitting: Neurology

## 2016-01-12 ENCOUNTER — Ambulatory Visit: Payer: Managed Care, Other (non HMO) | Admitting: Neurology

## 2016-01-17 ENCOUNTER — Encounter: Payer: Self-pay | Admitting: Neurology

## 2016-02-15 ENCOUNTER — Ambulatory Visit: Payer: Managed Care, Other (non HMO) | Admitting: Neurology

## 2016-02-16 ENCOUNTER — Encounter: Payer: Self-pay | Admitting: Neurology

## 2016-05-24 ENCOUNTER — Encounter: Payer: Self-pay | Admitting: Internal Medicine

## 2016-05-24 MED ORDER — FLUCONAZOLE 150 MG PO TABS: 150.0000 mg | ORAL_TABLET | Freq: Once | ORAL | 0 refills | Status: AC

## 2016-06-05 ENCOUNTER — Encounter: Payer: Self-pay | Admitting: Internal Medicine

## 2016-06-05 NOTE — Telephone Encounter (Signed)
Please abstract 

## 2016-12-29 ENCOUNTER — Other Ambulatory Visit: Payer: Self-pay | Admitting: Pain Medicine

## 2016-12-29 DIAGNOSIS — M79604 Pain in right leg: Secondary | ICD-10-CM

## 2016-12-29 DIAGNOSIS — M545 Low back pain, unspecified: Secondary | ICD-10-CM

## 2017-01-30 ENCOUNTER — Inpatient Hospital Stay
Admission: RE | Admit: 2017-01-30 | Discharge: 2017-01-30 | Disposition: A | Discharge status: Home / Self Care | Payer: Managed Care, Other (non HMO) | Source: Ambulatory Visit | Attending: Pain Medicine | Admitting: Pain Medicine

## 2017-02-11 ENCOUNTER — Other Ambulatory Visit: Payer: Managed Care, Other (non HMO)

## 2017-02-17 ENCOUNTER — Ambulatory Visit
Admission: RE | Admit: 2017-02-17 | Discharge: 2017-02-17 | Disposition: A | Discharge status: Home / Self Care | Payer: Managed Care, Other (non HMO) | Source: Ambulatory Visit | Attending: Pain Medicine | Admitting: Pain Medicine

## 2017-02-17 DIAGNOSIS — M79604 Pain in right leg: Secondary | ICD-10-CM

## 2017-02-17 DIAGNOSIS — M545 Low back pain, unspecified: Secondary | ICD-10-CM

## 2017-04-20 DIAGNOSIS — J029 Acute pharyngitis, unspecified: Secondary | ICD-10-CM | POA: Diagnosis not present

## 2017-04-20 DIAGNOSIS — R05 Cough: Secondary | ICD-10-CM | POA: Diagnosis not present

## 2017-04-20 DIAGNOSIS — H66001 Acute suppurative otitis media without spontaneous rupture of ear drum, right ear: Secondary | ICD-10-CM | POA: Diagnosis not present

## 2017-04-30 DIAGNOSIS — Z79899 Other long term (current) drug therapy: Secondary | ICD-10-CM | POA: Diagnosis not present

## 2017-04-30 DIAGNOSIS — Z79891 Long term (current) use of opiate analgesic: Secondary | ICD-10-CM | POA: Diagnosis not present

## 2017-04-30 DIAGNOSIS — M25519 Pain in unspecified shoulder: Secondary | ICD-10-CM | POA: Diagnosis not present

## 2017-04-30 DIAGNOSIS — G5700 Lesion of sciatic nerve, unspecified lower limb: Secondary | ICD-10-CM | POA: Diagnosis not present

## 2017-04-30 DIAGNOSIS — M545 Low back pain: Secondary | ICD-10-CM | POA: Diagnosis not present

## 2017-04-30 DIAGNOSIS — G894 Chronic pain syndrome: Secondary | ICD-10-CM | POA: Diagnosis not present

## 2017-05-22 DIAGNOSIS — M2550 Pain in unspecified joint: Secondary | ICD-10-CM | POA: Diagnosis not present

## 2017-05-22 DIAGNOSIS — M545 Low back pain: Secondary | ICD-10-CM | POA: Diagnosis not present

## 2017-05-22 DIAGNOSIS — M0609 Rheumatoid arthritis without rheumatoid factor, multiple sites: Secondary | ICD-10-CM | POA: Diagnosis not present

## 2017-05-28 DIAGNOSIS — G894 Chronic pain syndrome: Secondary | ICD-10-CM | POA: Diagnosis not present

## 2017-05-28 DIAGNOSIS — M545 Low back pain: Secondary | ICD-10-CM | POA: Diagnosis not present

## 2017-05-28 DIAGNOSIS — Z79891 Long term (current) use of opiate analgesic: Secondary | ICD-10-CM | POA: Diagnosis not present

## 2017-05-28 DIAGNOSIS — G5700 Lesion of sciatic nerve, unspecified lower limb: Secondary | ICD-10-CM | POA: Diagnosis not present

## 2017-05-28 DIAGNOSIS — Z79899 Other long term (current) drug therapy: Secondary | ICD-10-CM | POA: Diagnosis not present

## 2017-05-28 DIAGNOSIS — M25519 Pain in unspecified shoulder: Secondary | ICD-10-CM | POA: Diagnosis not present

## 2017-06-07 DIAGNOSIS — Z681 Body mass index (BMI) 19 or less, adult: Secondary | ICD-10-CM | POA: Diagnosis not present

## 2017-06-07 DIAGNOSIS — Z136 Encounter for screening for cardiovascular disorders: Secondary | ICD-10-CM | POA: Diagnosis not present

## 2017-06-07 DIAGNOSIS — Z13220 Encounter for screening for lipoid disorders: Secondary | ICD-10-CM | POA: Diagnosis not present

## 2017-06-07 DIAGNOSIS — Z713 Dietary counseling and surveillance: Secondary | ICD-10-CM | POA: Diagnosis not present

## 2017-06-25 DIAGNOSIS — G894 Chronic pain syndrome: Secondary | ICD-10-CM | POA: Diagnosis not present

## 2017-06-25 DIAGNOSIS — M545 Low back pain: Secondary | ICD-10-CM | POA: Diagnosis not present

## 2017-06-25 DIAGNOSIS — G5700 Lesion of sciatic nerve, unspecified lower limb: Secondary | ICD-10-CM | POA: Diagnosis not present

## 2017-06-25 DIAGNOSIS — M25519 Pain in unspecified shoulder: Secondary | ICD-10-CM | POA: Diagnosis not present

## 2017-06-25 DIAGNOSIS — Z79899 Other long term (current) drug therapy: Secondary | ICD-10-CM | POA: Diagnosis not present

## 2017-06-25 DIAGNOSIS — Z79891 Long term (current) use of opiate analgesic: Secondary | ICD-10-CM | POA: Diagnosis not present

## 2017-07-23 DIAGNOSIS — G894 Chronic pain syndrome: Secondary | ICD-10-CM | POA: Diagnosis not present

## 2017-07-23 DIAGNOSIS — M25519 Pain in unspecified shoulder: Secondary | ICD-10-CM | POA: Diagnosis not present

## 2017-07-23 DIAGNOSIS — G5700 Lesion of sciatic nerve, unspecified lower limb: Secondary | ICD-10-CM | POA: Diagnosis not present

## 2017-07-23 DIAGNOSIS — M545 Low back pain: Secondary | ICD-10-CM | POA: Diagnosis not present

## 2017-08-16 DIAGNOSIS — G5700 Lesion of sciatic nerve, unspecified lower limb: Secondary | ICD-10-CM | POA: Diagnosis not present

## 2017-08-20 DIAGNOSIS — M545 Low back pain: Secondary | ICD-10-CM | POA: Diagnosis not present

## 2017-08-20 DIAGNOSIS — G894 Chronic pain syndrome: Secondary | ICD-10-CM | POA: Diagnosis not present

## 2017-08-20 DIAGNOSIS — M25519 Pain in unspecified shoulder: Secondary | ICD-10-CM | POA: Diagnosis not present

## 2017-08-20 DIAGNOSIS — G5700 Lesion of sciatic nerve, unspecified lower limb: Secondary | ICD-10-CM | POA: Diagnosis not present

## 2017-09-17 DIAGNOSIS — G894 Chronic pain syndrome: Secondary | ICD-10-CM | POA: Diagnosis not present

## 2017-09-17 DIAGNOSIS — Z79899 Other long term (current) drug therapy: Secondary | ICD-10-CM | POA: Diagnosis not present

## 2017-09-17 DIAGNOSIS — M069 Rheumatoid arthritis, unspecified: Secondary | ICD-10-CM | POA: Diagnosis not present

## 2017-09-17 DIAGNOSIS — Z79891 Long term (current) use of opiate analgesic: Secondary | ICD-10-CM | POA: Diagnosis not present

## 2017-09-17 DIAGNOSIS — G5700 Lesion of sciatic nerve, unspecified lower limb: Secondary | ICD-10-CM | POA: Diagnosis not present

## 2017-10-17 DIAGNOSIS — G894 Chronic pain syndrome: Secondary | ICD-10-CM | POA: Diagnosis not present

## 2017-10-17 DIAGNOSIS — M545 Low back pain: Secondary | ICD-10-CM | POA: Diagnosis not present

## 2017-10-17 DIAGNOSIS — G5700 Lesion of sciatic nerve, unspecified lower limb: Secondary | ICD-10-CM | POA: Diagnosis not present

## 2017-11-14 DIAGNOSIS — M545 Low back pain: Secondary | ICD-10-CM | POA: Diagnosis not present

## 2017-11-14 DIAGNOSIS — G894 Chronic pain syndrome: Secondary | ICD-10-CM | POA: Diagnosis not present

## 2017-11-14 DIAGNOSIS — M069 Rheumatoid arthritis, unspecified: Secondary | ICD-10-CM | POA: Diagnosis not present

## 2017-12-06 DIAGNOSIS — J029 Acute pharyngitis, unspecified: Secondary | ICD-10-CM | POA: Diagnosis not present

## 2017-12-11 ENCOUNTER — Ambulatory Visit: Payer: BLUE CROSS/BLUE SHIELD | Admitting: Family Medicine

## 2017-12-11 ENCOUNTER — Encounter: Payer: Self-pay | Admitting: Family Medicine

## 2017-12-11 VITALS — BP 110/78 | HR 101 | Temp 99.0°F | Ht 62.0 in | Wt 99.0 lb

## 2017-12-11 DIAGNOSIS — J101 Influenza due to other identified influenza virus with other respiratory manifestations: Secondary | ICD-10-CM

## 2017-12-11 DIAGNOSIS — R05 Cough: Secondary | ICD-10-CM | POA: Diagnosis not present

## 2017-12-11 DIAGNOSIS — R52 Pain, unspecified: Secondary | ICD-10-CM

## 2017-12-11 DIAGNOSIS — R059 Cough, unspecified: Secondary | ICD-10-CM

## 2017-12-11 DIAGNOSIS — R509 Fever, unspecified: Secondary | ICD-10-CM

## 2017-12-11 LAB — POC INFLUENZA A&B (BINAX/QUICKVUE)
Influenza A, POC: POSITIVE — AB
Influenza B, POC: NEGATIVE

## 2017-12-11 MED ORDER — OSELTAMIVIR PHOSPHATE 75 MG PO CAPS: 75.0000 mg | ORAL_CAPSULE | Freq: Two times a day (BID) | ORAL | 0 refills | Status: AC

## 2017-12-11 MED ORDER — HYDROCOD POLST-CPM POLST ER 10-8 MG/5ML PO SUER: 5.0000 mL | Freq: Two times a day (BID) | ORAL | 0 refills | Status: DC | PRN

## 2017-12-11 NOTE — Progress Notes (Signed)
Subjective:    Patient ID: Debra Gibbs, female    DOB: [birth year], 37 y.o.   MRN: [id number]  HPI   Patient presents to clinic due to cough, sore throat, body aches, fever and chills for the past 2 days.  States her grandmother recently tested positive for the flu and her grandma was started on Tamiflu.    [facility], was tested for strep due to sore throat which was negative and was told that it was too early in the season to test for flu.  Patient continued to feel not well this morning, so decided to come in for evaluation.  Denies any nausea or vomiting.  Denies diarrhea.  Patient Active Problem List   Diagnosis Date Noted  . Sciatica of left side 12/09/2015  . Chronic lower back pain 12/09/2015  . Viral upper respiratory illness 07/02/2015  . NSVD (normal spontaneous vaginal delivery) 10/14/2014  . Normal labor 10/13/2014  . Acute frontal sinusitis 04/20/2014  . Healthcare maintenance 05/08/2013  . Dactylitis 03/04/2012  . Hemorrhoids 12/22/2011  . Rheumatoid arthritis ([place]) 11/04/2009  . Multiple joint pain 07/21/2008  . NEURODERMATITIS 04/29/2007  . ANXIETY 12/24/2006   Social History   Tobacco Use  . Smoking status: Former Smoker    Last attempt to quit: 04/10/2005    Years since quitting: 12.6  . Smokeless tobacco: Never Used  Substance Use Topics  . Alcohol use: No    Alcohol/week: 0.0 standard drinks   Review of Systems  Constitutional: + chills, fatigue and fever.  HENT: + congestion, ear pain, sinus pain and sore throat.   Eyes: Negative.   Respiratory: +cough   Cardiovascular: Negative for chest pain, palpitations and leg swelling.  Gastrointestinal: Negative for abdominal pain, diarrhea, nausea and vomiting.  Genitourinary: Negative for dysuria, frequency and urgency.  Musculoskeletal: Negative for arthralgias and myalgias.  Skin: Negative for color change, pallor and rash.  Neurological: Negative for syncope,  light-headedness and headaches.  Psychiatric/Behavioral: The patient is not nervous/anxious.       Objective:   Physical Exam  Constitutional: She is oriented to person, place, and time. She appears well-nourished. No distress.  Appears tired.   HENT:  Head: Normocephalic and atraumatic.  Fullness bilat TMs, Rhionorrhea, Postnasal drip and erythema of pharnyx.  Eyes: Conjunctivae and EOM are normal. No scleral icterus.  Neck: Neck supple. No tracheal deviation present.  Cardiovascular: Normal rate and regular rhythm.  No murmur heard. Pulmonary/Chest: Effort normal. No respiratory distress.  Scattered rhonchi upper lobes, improved with coughing  Neurological: She is alert and oriented to person, place, and time.  Skin: Skin is warm and dry. No pallor.  Psychiatric: She has a normal mood and affect. Her behavior is normal.  Nursing note and vitals reviewed.     Vitals:   12/11/17 1044  BP: 110/78  Pulse: (!) 101  Temp: 99 F (37.2 C)  SpO2: 98%   Assessment & Plan:   Influenza A -- rapid flu swab is positive for influenza A virus in clinic.  She will begin Tamiflu 75 mg twice daily for 5 days.  She has been given Tussionex to use as needed for cough suppression and to allow self some rest.  Advised she can alternate Tylenol and Motrin to control body aches and fever.  Encouraged to keep up with good fluid intake with water, Gatorade, chicken/beef broth.  Advised to keep it bland to avoid any stomach upset.  Patient is not  nauseous at this time and declines prescription for nausea medication.  Patient given work note for out the rest of the week, she is aware that she is technically contagious for 5 to 7 days after symptom onset.  Patient encouraged to do good handwashing and to sanitize commonly use services within her home.  Follow-up as needed, return to clinic if symptoms persist or worsen.

## 2017-12-11 NOTE — Patient Instructions (Signed)

## 2017-12-18 DIAGNOSIS — Z79891 Long term (current) use of opiate analgesic: Secondary | ICD-10-CM | POA: Diagnosis not present

## 2017-12-18 DIAGNOSIS — M545 Low back pain: Secondary | ICD-10-CM | POA: Diagnosis not present

## 2017-12-18 DIAGNOSIS — G5700 Lesion of sciatic nerve, unspecified lower limb: Secondary | ICD-10-CM | POA: Diagnosis not present

## 2017-12-18 DIAGNOSIS — G894 Chronic pain syndrome: Secondary | ICD-10-CM | POA: Diagnosis not present

## 2017-12-18 DIAGNOSIS — M069 Rheumatoid arthritis, unspecified: Secondary | ICD-10-CM | POA: Diagnosis not present

## 2017-12-18 DIAGNOSIS — Z79899 Other long term (current) drug therapy: Secondary | ICD-10-CM | POA: Diagnosis not present

## 2017-12-19 ENCOUNTER — Other Ambulatory Visit: Payer: Self-pay | Admitting: Family Medicine

## 2017-12-19 DIAGNOSIS — B001 Herpesviral vesicular dermatitis: Secondary | ICD-10-CM

## 2017-12-19 DIAGNOSIS — B370 Candidal stomatitis: Secondary | ICD-10-CM

## 2017-12-19 MED ORDER — ACYCLOVIR 400 MG PO TABS: 400.0000 mg | ORAL_TABLET | Freq: Three times a day (TID) | ORAL | 0 refills | Status: AC

## 2017-12-19 MED ORDER — FLUCONAZOLE 150 MG PO TABS: 150.0000 mg | ORAL_TABLET | Freq: Once | ORAL | 0 refills | Status: AC

## 2017-12-19 MED ORDER — NYSTATIN 100000 UNIT/ML MT SUSP: 5.0000 mL | Freq: Four times a day (QID) | OROMUCOSAL | 0 refills | Status: DC

## 2017-12-19 NOTE — Progress Notes (Signed)
Rx sent to pharmacy for thrush and cold sore

## 2018-01-15 DIAGNOSIS — M069 Rheumatoid arthritis, unspecified: Secondary | ICD-10-CM | POA: Diagnosis not present

## 2018-01-15 DIAGNOSIS — M545 Low back pain: Secondary | ICD-10-CM | POA: Diagnosis not present

## 2018-01-15 DIAGNOSIS — G894 Chronic pain syndrome: Secondary | ICD-10-CM | POA: Diagnosis not present

## 2018-01-15 DIAGNOSIS — G5700 Lesion of sciatic nerve, unspecified lower limb: Secondary | ICD-10-CM | POA: Diagnosis not present

## 2018-02-12 DIAGNOSIS — M0609 Rheumatoid arthritis without rheumatoid factor, multiple sites: Secondary | ICD-10-CM | POA: Diagnosis not present

## 2018-02-12 DIAGNOSIS — G894 Chronic pain syndrome: Secondary | ICD-10-CM | POA: Diagnosis not present

## 2018-02-12 DIAGNOSIS — Z79899 Other long term (current) drug therapy: Secondary | ICD-10-CM | POA: Diagnosis not present

## 2018-02-12 DIAGNOSIS — Z79891 Long term (current) use of opiate analgesic: Secondary | ICD-10-CM | POA: Diagnosis not present

## 2018-02-12 DIAGNOSIS — G5700 Lesion of sciatic nerve, unspecified lower limb: Secondary | ICD-10-CM | POA: Diagnosis not present

## 2018-02-12 DIAGNOSIS — M2550 Pain in unspecified joint: Secondary | ICD-10-CM | POA: Diagnosis not present

## 2018-02-12 DIAGNOSIS — D8989 Other specified disorders involving the immune mechanism, not elsewhere classified: Secondary | ICD-10-CM | POA: Diagnosis not present

## 2018-02-12 DIAGNOSIS — M064 Inflammatory polyarthropathy: Secondary | ICD-10-CM | POA: Diagnosis not present

## 2018-02-12 DIAGNOSIS — M069 Rheumatoid arthritis, unspecified: Secondary | ICD-10-CM | POA: Diagnosis not present

## 2018-02-12 DIAGNOSIS — M25529 Pain in unspecified elbow: Secondary | ICD-10-CM | POA: Diagnosis not present

## 2018-02-12 DIAGNOSIS — M549 Dorsalgia, unspecified: Secondary | ICD-10-CM | POA: Diagnosis not present

## 2018-02-12 DIAGNOSIS — M545 Low back pain: Secondary | ICD-10-CM | POA: Diagnosis not present

## 2018-03-14 DIAGNOSIS — G894 Chronic pain syndrome: Secondary | ICD-10-CM | POA: Diagnosis not present

## 2018-03-14 DIAGNOSIS — M79672 Pain in left foot: Secondary | ICD-10-CM | POA: Diagnosis not present

## 2018-03-14 DIAGNOSIS — M79671 Pain in right foot: Secondary | ICD-10-CM | POA: Diagnosis not present

## 2018-03-14 DIAGNOSIS — M79642 Pain in left hand: Secondary | ICD-10-CM | POA: Diagnosis not present

## 2018-03-14 DIAGNOSIS — M545 Low back pain: Secondary | ICD-10-CM | POA: Diagnosis not present

## 2018-03-14 DIAGNOSIS — M79641 Pain in right hand: Secondary | ICD-10-CM | POA: Diagnosis not present

## 2018-03-14 DIAGNOSIS — G5700 Lesion of sciatic nerve, unspecified lower limb: Secondary | ICD-10-CM | POA: Diagnosis not present

## 2018-03-14 DIAGNOSIS — M549 Dorsalgia, unspecified: Secondary | ICD-10-CM | POA: Diagnosis not present

## 2018-04-08 DIAGNOSIS — M069 Rheumatoid arthritis, unspecified: Secondary | ICD-10-CM | POA: Diagnosis not present

## 2018-04-08 DIAGNOSIS — G5700 Lesion of sciatic nerve, unspecified lower limb: Secondary | ICD-10-CM | POA: Diagnosis not present

## 2018-04-16 DIAGNOSIS — Z79899 Other long term (current) drug therapy: Secondary | ICD-10-CM | POA: Diagnosis not present

## 2018-04-16 DIAGNOSIS — M545 Low back pain: Secondary | ICD-10-CM | POA: Diagnosis not present

## 2018-04-16 DIAGNOSIS — G894 Chronic pain syndrome: Secondary | ICD-10-CM | POA: Diagnosis not present

## 2018-04-16 DIAGNOSIS — Z79891 Long term (current) use of opiate analgesic: Secondary | ICD-10-CM | POA: Diagnosis not present

## 2018-04-16 DIAGNOSIS — M069 Rheumatoid arthritis, unspecified: Secondary | ICD-10-CM | POA: Diagnosis not present

## 2018-05-02 DIAGNOSIS — M2550 Pain in unspecified joint: Secondary | ICD-10-CM | POA: Diagnosis not present

## 2018-05-02 DIAGNOSIS — M25529 Pain in unspecified elbow: Secondary | ICD-10-CM | POA: Diagnosis not present

## 2018-05-02 DIAGNOSIS — M0609 Rheumatoid arthritis without rheumatoid factor, multiple sites: Secondary | ICD-10-CM | POA: Diagnosis not present

## 2018-05-02 DIAGNOSIS — M25519 Pain in unspecified shoulder: Secondary | ICD-10-CM | POA: Diagnosis not present

## 2018-05-03 DIAGNOSIS — H66001 Acute suppurative otitis media without spontaneous rupture of ear drum, right ear: Secondary | ICD-10-CM | POA: Diagnosis not present

## 2018-05-03 DIAGNOSIS — R51 Headache: Secondary | ICD-10-CM | POA: Diagnosis not present

## 2018-05-08 DIAGNOSIS — R05 Cough: Secondary | ICD-10-CM | POA: Diagnosis not present

## 2018-05-08 MED ORDER — FLUCONAZOLE 150 MG PO TABS: 150.0000 mg | ORAL_TABLET | Freq: Once | ORAL | 1 refills | Status: AC

## 2018-05-09 ENCOUNTER — Ambulatory Visit: Payer: BLUE CROSS/BLUE SHIELD | Admitting: Internal Medicine

## 2018-05-09 ENCOUNTER — Encounter: Payer: Self-pay | Admitting: Internal Medicine

## 2018-05-09 VITALS — BP 106/70 | HR 98 | Temp 98.5°F | Wt 103.0 lb

## 2018-05-09 DIAGNOSIS — R059 Cough, unspecified: Secondary | ICD-10-CM

## 2018-05-09 DIAGNOSIS — H6503 Acute serous otitis media, bilateral: Secondary | ICD-10-CM

## 2018-05-09 DIAGNOSIS — R05 Cough: Secondary | ICD-10-CM

## 2018-05-09 DIAGNOSIS — J0100 Acute maxillary sinusitis, unspecified: Secondary | ICD-10-CM | POA: Diagnosis not present

## 2018-05-09 MED ORDER — ALBUTEROL SULFATE HFA 108 (90 BASE) MCG/ACT IN AERS: 2.0000 | INHALATION_SPRAY | Freq: Four times a day (QID) | RESPIRATORY_TRACT | 0 refills | Status: DC | PRN

## 2018-05-09 MED ORDER — HYDROCODONE-HOMATROPINE 5-1.5 MG/5ML PO SYRP: 5.0000 mL | ORAL_SOLUTION | Freq: Three times a day (TID) | ORAL | 0 refills | Status: DC | PRN

## 2018-05-09 NOTE — Progress Notes (Signed)
HPI  Pt presents to the clinic today with c/o runny nose, nasal congestion, sore throat, and cough. She reports this started about 1 week ago. The cough is productive of yellow mucous. It seem worse at night. She was seen at [facility] for the same on 1/24. She was diagnosed with a sinus infection and double ear infection. She was treated with Augmentin. [facility] to [facility] on 1/[facility], they diagnosed her with acute bronchitis and sent her in some Prednisone. She reports she is afraid to take it because it will keep her up all night. She denies fever, chills or body aches. She has taken Delsym OTC. She does not smoke. She has had sick contacts.  Review of Systems      Past Medical History:  Diagnosis Date  . Anxiety   . Headache    with pregnancy only  . Rheumatoid arthritis(714.0)   . Vision abnormalities     Family History  Problem Relation Age of Onset  . Lupus Mother   . Arthritis Father     Social History   Socioeconomic History  . Marital status: Married    Spouse name: Not on file  . Number of children: 2  . Years of education: Not on file  . Highest education level: Not on file  Occupational History  . Occupation: waitressing part-time, [employer] in [place]  . Financial resource strain: Not on file  . Food insecurity:    Worry: Not on file    Inability: Not on file  . Transportation needs:    Medical: Not on file    Non-medical: Not on file  Tobacco Use  . Smoking status: Former Smoker    Last attempt to quit: 04/10/2005    Years since quitting: 13.0  . Smokeless tobacco: Never Used  Substance and Sexual Activity  . Alcohol use: No    Alcohol/week: 0.0 standard drinks  . Drug use: No  . Sexual activity: Not Currently    Partners: Male    Birth control/protection: None  Lifestyle  . Physical activity:    Days per week: Not on file    Minutes per session: Not on file  . Stress: Not on file  Relationships  . Social connections:    Talks on phone: Not  on file    Gets together: Not on file    Attends religious service: Not on file    Active member of club or organization: Not on file    Attends meetings of clubs or organizations: Not on file    Relationship status: Not on file  . Intimate partner violence:    Fear of current or ex partner: Not on file    Emotionally abused: Not on file    Physically abused: Not on file    Forced sexual activity: Not on file  Other Topics Concern  . Not on file  Social History Narrative  . Not on file    Allergies  Allergen Reactions  . Citalopram Hydrobromide Other (See Comments)    REACTION: shakes and made her jaw tight  . Macrobid [Nitrofurantoin] Nausea And Vomiting     Constitutional: Denies headache, fatigue, fever or abrupt weight changes.  HEENT:  Positive ear pain, runny nose, nasal congestion, sore throat. Denies eye redness, eye pain, pressure behind the eyes, facial pain, ear pain, ringing in the ears, wax buildup, or bloody nose. Respiratory: Positive cough. Denies difficulty breathing or shortness of breath.  Cardiovascular: Denies chest pain, chest  tightness, palpitations or swelling in the hands or feet.   No other specific complaints in a complete review of systems (except as listed in HPI above).  Objective:   BP 106/70   Pulse 98   Temp 98.5 F (36.9 C) (Oral)   Wt 103 lb (46.7 kg)   SpO2 98%   BMI 18.84 kg/m   Wt Readings from Last 3 Encounters:  05/09/18 103 lb (46.7 kg)  12/11/17 99 lb (44.9 kg)  12/21/15 113 lb 12.8 oz (51.6 kg)     General: Appears her stated age, well developed, well nourished in NAD. HEENT: Head: normal shape and size, mild right maxillary sinus tenderness noted;  Ears: Tm's gray and intact, normal light reflex; Nose: mucosa pink and moist, septum midline; Throat/Mouth: + PND. Teeth present, mucosa pink and moist, no exudate noted, no lesions or ulcerations noted.  Neck: No cervical lymphadenopathy.  Cardiovascular: Normal rate and  rhythm. S1,S2 noted.  No murmur, rubs or gallops noted.  Pulmonary/Chest: Normal effort and positive vesicular breath sounds. No respiratory distress. No wheezes, rales or ronchi noted.       Assessment & Plan:   Acute Maxillary Sinusitis, Bilateral Otitis Media, Cough:  Get some rest and drink plenty of water Start Zyrtec and Flonase OTC RX for Albuterol inhaler 1-2 puffs every 4 hours as needed RX for Hycodan for cough Continue Augmentin until finished If no improvement in the next 3-4 days, would recommend start Prednisone.  RTC as needed or if symptoms persist.   Webb Silversmith, NP

## 2018-05-09 NOTE — Patient Instructions (Signed)

## 2018-05-14 DIAGNOSIS — M069 Rheumatoid arthritis, unspecified: Secondary | ICD-10-CM | POA: Diagnosis not present

## 2018-05-14 DIAGNOSIS — M545 Low back pain: Secondary | ICD-10-CM | POA: Diagnosis not present

## 2018-05-14 DIAGNOSIS — G5700 Lesion of sciatic nerve, unspecified lower limb: Secondary | ICD-10-CM | POA: Diagnosis not present

## 2018-05-14 DIAGNOSIS — G894 Chronic pain syndrome: Secondary | ICD-10-CM | POA: Diagnosis not present

## 2018-06-08 DIAGNOSIS — Z136 Encounter for screening for cardiovascular disorders: Secondary | ICD-10-CM | POA: Diagnosis not present

## 2018-06-08 DIAGNOSIS — Z13220 Encounter for screening for lipoid disorders: Secondary | ICD-10-CM | POA: Diagnosis not present

## 2018-06-08 DIAGNOSIS — Z713 Dietary counseling and surveillance: Secondary | ICD-10-CM | POA: Diagnosis not present

## 2018-06-11 DIAGNOSIS — G894 Chronic pain syndrome: Secondary | ICD-10-CM | POA: Diagnosis not present

## 2018-06-11 DIAGNOSIS — G5700 Lesion of sciatic nerve, unspecified lower limb: Secondary | ICD-10-CM | POA: Diagnosis not present

## 2018-06-11 DIAGNOSIS — M545 Low back pain: Secondary | ICD-10-CM | POA: Diagnosis not present

## 2018-06-11 DIAGNOSIS — M069 Rheumatoid arthritis, unspecified: Secondary | ICD-10-CM | POA: Diagnosis not present

## 2018-07-03 ENCOUNTER — Telehealth: Payer: BLUE CROSS/BLUE SHIELD | Admitting: Family

## 2018-07-03 DIAGNOSIS — R6889 Other general symptoms and signs: Secondary | ICD-10-CM

## 2018-07-03 DIAGNOSIS — Z7189 Other specified counseling: Secondary | ICD-10-CM

## 2018-07-03 MED ORDER — BENZONATATE 100 MG PO CAPS: 100.0000 mg | ORAL_CAPSULE | Freq: Three times a day (TID) | ORAL | 0 refills | Status: DC | PRN

## 2018-07-03 NOTE — Progress Notes (Signed)
E-Visit for Corona Virus Screening  Based on your current symptoms, you may very well have the virus, however your symptoms are mild. Currently, not all patients are being tested. If the symptoms are mild and there is not a known exposure, performing the test is not indicated.   Approximately 5 minutes was spent documenting and reviewing patient's chart.    Coronavirus disease 2019 (COVID-19) is a respiratory illness that can spread from person to person. The virus that causes COVID-19 is a new virus that was first identified in the country of [place] but is now found in multiple other countries and has spread to the [place].  Symptoms associated with the virus are mild to severe fever, cough, and shortness of breath. There is currently no vaccine to protect against COVID-19, and there is no specific antiviral treatment for the virus.   To be considered HIGH RISK for Coronavirus (COVID-19), you have to meet the following criteria:  . Traveled to [place], [place], [place], [place] or [place]; or in the [place] to [place], [place], [place], or [place]; and have fever, cough, and shortness of breath within the last 2 weeks of travel OR  . Been in close contact with a person diagnosed with COVID-19 within the last 2 weeks and have fever, cough, and shortness of breath  . IF YOU DO NOT MEET THESE CRITERIA, YOU ARE CONSIDERED LOW RISK FOR COVID-19.   It is vitally important that if you feel that you have an infection such as this virus or any other virus that you stay home and away from places where you may spread it to others.  You should self-quarantine for 14 days if you have symptoms that could potentially be coronavirus and avoid contact with people age 65 and older.   You can use medication such as A prescription cough medication called Tessalon Perles 100 mg. You may take 1-2 capsules every 8 hours as needed for cough  You may also take acetaminophen (Tylenol) as needed for  fever.   Reduce your risk of any infection by using the same precautions used for avoiding the common cold or flu:  . Wash your hands often with soap and warm water for at least 20 seconds.  If soap and water are not readily available, use an alcohol-based hand sanitizer with at least 60% alcohol.  . If coughing or sneezing, cover your mouth and nose by coughing or sneezing into the elbow areas of your shirt or coat, into a tissue or into your sleeve (not your hands). . Avoid shaking hands with others and consider head nods or verbal greetings only. . Avoid touching your eyes, nose, or mouth with unwashed hands.  . Avoid close contact with people who are sick. . Avoid places or events with large numbers of people in one location, like concerts or sporting events. . Carefully consider travel plans you have or are making. . If you are planning any travel outside or inside the [place], visit the CDC's Travelers' Health webpage for the latest health notices. . If you have some symptoms but not all symptoms, continue to monitor at home and seek medical attention if your symptoms worsen. . If you are having a medical emergency, call 911.  HOME CARE . Only take medications as instructed by your medical team. . Drink plenty of fluids and get plenty of rest. . A steam or ultrasonic humidifier can help if you have congestion.   GET HELP RIGHT AWAY IF: .   You develop worsening fever. . You become short of breath . You cough up blood. . Your symptoms become more severe MAKE SURE YOU   Understand these instructions.  Will watch your condition.  Will get help right away if you are not doing well or get worse.  Your e-visit answers were reviewed by a board certified advanced clinical practitioner to complete your personal care plan.  Depending on the condition, your plan could have included both over the counter or prescription medications.  If there is a problem please reply once you have received a  response from your provider. Your safety is important to us.  If you have drug allergies check your prescription carefully.    You can use MyChart to ask questions about today's visit, request a non-urgent call back, or ask for a work or school excuse for 24 hours related to this e-Visit. If it has been greater than 24 hours you will need to follow up with your provider, or enter a new e-Visit to address those concerns. You will get an e-mail in the next two days asking about your experience.  I hope that your e-visit has been valuable and will speed your recovery. Thank you for using e-visits.    

## 2018-07-04 ENCOUNTER — Other Ambulatory Visit: Payer: Self-pay

## 2018-07-04 ENCOUNTER — Encounter: Payer: Self-pay | Admitting: Internal Medicine

## 2018-07-04 ENCOUNTER — Ambulatory Visit (INDEPENDENT_AMBULATORY_CARE_PROVIDER_SITE_OTHER): Payer: BLUE CROSS/BLUE SHIELD | Admitting: Internal Medicine

## 2018-07-04 VITALS — Resp 14

## 2018-07-04 DIAGNOSIS — J111 Influenza due to unidentified influenza virus with other respiratory manifestations: Secondary | ICD-10-CM | POA: Insufficient documentation

## 2018-07-04 DIAGNOSIS — R69 Illness, unspecified: Secondary | ICD-10-CM

## 2018-07-04 MED ORDER — HYDROCODONE-HOMATROPINE 5-1.5 MG/5ML PO SYRP: 5.0000 mL | ORAL_SOLUTION | Freq: Every evening | ORAL | 0 refills | Status: DC | PRN

## 2018-07-04 MED ORDER — OSELTAMIVIR PHOSPHATE 75 MG PO CAPS: 75.0000 mg | ORAL_CAPSULE | Freq: Two times a day (BID) | ORAL | 0 refills | Status: DC

## 2018-07-04 NOTE — Progress Notes (Signed)
Subjective:    Patient ID: Debra Gibbs, female    DOB: 1980/05/05, 38 y.o.   MRN: [id number]  HPI Webex video visit ---Martin Majestic, patient in her home and I am in my office Viviana Simpler MD) She consented to the video visit  She has been sick for about 4-5 days At first, thought it was allergy  Then got bad in the past 2 days and "awful" yesterday Severe myalgias Right side of her face seems numb Some facial pressure but no sig drainage Severe hacking cough that is dry ---tried the benzonatate and it didn't help but some relief with mucinex  No SOB Has had low grade fever and awoke in a sweat yesterday morning Sore throat now---relates to the hacking cough  Current Outpatient Medications on File Prior to Visit  Medication Sig Dispense Refill  . benzonatate (TESSALON PERLES) 100 MG capsule Take 1 capsule (100 mg total) by mouth 3 (three) times daily as needed. 20 capsule 0  . Multiple Vitamin (MULTIVITAMIN) tablet Take 1 tablet by mouth daily.    Marland Kitchen albuterol (PROVENTIL HFA;VENTOLIN HFA) 108 (90 Base) MCG/ACT inhaler Inhale 2 puffs into the lungs every 6 (six) hours as needed for wheezing or shortness of breath. (Patient not taking: Reported on 07/04/2018) 1 Inhaler 0   No current facility-administered medications on file prior to visit.     Allergies  Allergen Reactions  . Citalopram Hydrobromide Other (See Comments)    REACTION: shakes and made her jaw tight  . Macrobid [Nitrofurantoin] Nausea And Vomiting    Past Medical History:  Diagnosis Date  . Anxiety   . Headache    with pregnancy only  . Rheumatoid arthritis(714.0)   . Vision abnormalities     Past Surgical History:  Procedure Laterality Date  . WISDOM TOOTH EXTRACTION      Family History  Problem Relation Age of Onset  . Lupus Mother   . Arthritis Father     Social History   Socioeconomic History  . Marital status: Married    Spouse name: Not on file  . Number of children: 2  . Years of  education: Not on file  . Highest education level: Not on file  Occupational History  . Occupation: waitressing part-time, [employer] in [place]  . Financial resource strain: Not on file  . Food insecurity:    Worry: Not on file    Inability: Not on file  . Transportation needs:    Medical: Not on file    Non-medical: Not on file  Tobacco Use  . Smoking status: Former Smoker    Last attempt to quit: 04/10/2005    Years since quitting: 13.2  . Smokeless tobacco: Never Used  Substance and Sexual Activity  . Alcohol use: No    Alcohol/week: 0.0 standard drinks  . Drug use: No  . Sexual activity: Not Currently    Partners: Male    Birth control/protection: None  Lifestyle  . Physical activity:    Days per week: Not on file    Minutes per session: Not on file  . Stress: Not on file  Relationships  . Social connections:    Talks on phone: Not on file    Gets together: Not on file    Attends religious service: Not on file    Active member of club or organization: Not on file    Attends meetings of clubs or organizations: Not on file    Relationship status: Not on  file  . Intimate partner violence:    Fear of current or ex partner: Not on file    Emotionally abused: Not on file    Physically abused: Not on file    Forced sexual activity: Not on file  Other Topics Concern  . Not on file  Social History Narrative  . Not on file   Review of Systems Lives with husband and 3 daughters Hasn't travelled out of area No ill exposures    Objective:   Physical Exam  Constitutional: No distress.  Alert Breathing easy Appears mildly ill but comfortable  Neck:  No visible nodes  Psychiatric: She has a normal mood and affect. Her behavior is normal.           Assessment & Plan:

## 2018-07-04 NOTE — Assessment & Plan Note (Signed)
Not typical time presentation for the flu---but clinically seems flu like now Could be COVID19 by symptoms----but no risks (so won't qualify for testing) Will try empiric Rx with tamiflu despite the time course Hydrocodone cough syrup for symptom relief (hopefully can then sleep at night) Analgesics, etc She is socially isolating---reviewed this Will need to consider empiric Rx for family if they get sick Call 9:30-9:46am

## 2018-07-29 ENCOUNTER — Encounter: Payer: Self-pay | Admitting: Internal Medicine

## 2018-07-29 ENCOUNTER — Ambulatory Visit (INDEPENDENT_AMBULATORY_CARE_PROVIDER_SITE_OTHER): Payer: BLUE CROSS/BLUE SHIELD | Admitting: Internal Medicine

## 2018-07-29 ENCOUNTER — Other Ambulatory Visit: Payer: Self-pay

## 2018-07-29 VITALS — Temp 98.9°F | Resp 14 | Wt 105.0 lb

## 2018-07-29 DIAGNOSIS — J0110 Acute frontal sinusitis, unspecified: Secondary | ICD-10-CM | POA: Diagnosis not present

## 2018-07-29 MED ORDER — HYDROCODONE-HOMATROPINE 5-1.5 MG/5ML PO SYRP: 5.0000 mL | ORAL_SOLUTION | Freq: Every evening | ORAL | 0 refills | Status: DC | PRN

## 2018-07-29 MED ORDER — FLUCONAZOLE 150 MG PO TABS: 150.0000 mg | ORAL_TABLET | Freq: Once | ORAL | 1 refills | Status: AC

## 2018-07-29 MED ORDER — AMOXICILLIN-POT CLAVULANATE 875-125 MG PO TABS: 1.0000 | ORAL_TABLET | Freq: Two times a day (BID) | ORAL | 0 refills | Status: DC

## 2018-07-29 NOTE — Telephone Encounter (Signed)
Please set her up for a virtual visit sometime today or tomorrow

## 2018-07-29 NOTE — Assessment & Plan Note (Signed)
Has had persistent respiratory illness for a month Now seems clearly to be centered in her sinuses Not clear if this could have started as COVID---but testing not really available under these circumstances Will treat with augmentin and refill cough syrup Fluconazole for prn if gets vaginal yeast

## 2018-07-29 NOTE — Progress Notes (Signed)
Subjective:    Patient ID: Debra Gibbs, female    DOB: 28-Aug-1980, 38 y.o.   MRN: [id number]  HPI Virtual visit due to ongoing respiratory symptoms Identification done Discussed billing and she gave consent She is in her home--I am in my office  She feels "all mucus"  Bothering her stomach --she seems to be swallowing it Feels it in her head and down into throat Lots of cough still---will get hacking fits in day, but worse at night Will clear out in the morning Very stuffy Feels congestion under her eyes No fever No SOB  Current Outpatient Medications on File Prior to Visit  Medication Sig Dispense Refill  . HYDROcodone-homatropine (HYCODAN) 5-1.5 MG/5ML syrup Take 5 mLs by mouth at bedtime as needed for cough. 120 mL 0  . Multiple Vitamin (MULTIVITAMIN) tablet Take 1 tablet by mouth daily.     No current facility-administered medications on file prior to visit.     Allergies  Allergen Reactions  . Citalopram Hydrobromide Other (See Comments)    REACTION: shakes and made her jaw tight  . Macrobid [Nitrofurantoin] Nausea And Vomiting    Past Medical History:  Diagnosis Date  . Anxiety   . Headache    with pregnancy only  . Rheumatoid arthritis(714.0)   . Vision abnormalities     Past Surgical History:  Procedure Laterality Date  . WISDOM TOOTH EXTRACTION      Family History  Problem Relation Age of Onset  . Lupus Mother   . Arthritis Father     Social History   Socioeconomic History  . Marital status: Married    Spouse name: Not on file  . Number of children: 2  . Years of education: Not on file  . Highest education level: Not on file  Occupational History  . Occupation: waitressing part-time, [employer] in [place]  . Financial resource strain: Not on file  . Food insecurity:    Worry: Not on file    Inability: Not on file  . Transportation needs:    Medical: Not on file    Non-medical: Not on file  Tobacco Use  . Smoking  status: Former Smoker    Last attempt to quit: 04/10/2005    Years since quitting: 13.3  . Smokeless tobacco: Never Used  Substance and Sexual Activity  . Alcohol use: No    Alcohol/week: 0.0 standard drinks  . Drug use: No  . Sexual activity: Not Currently    Partners: Male    Birth control/protection: None  Lifestyle  . Physical activity:    Days per week: Not on file    Minutes per session: Not on file  . Stress: Not on file  Relationships  . Social connections:    Talks on phone: Not on file    Gets together: Not on file    Attends religious service: Not on file    Active member of club or organization: Not on file    Attends meetings of clubs or organizations: Not on file    Relationship status: Not on file  . Intimate partner violence:    Fear of current or ex partner: Not on file    Emotionally abused: Not on file    Physically abused: Not on file    Forced sexual activity: Not on file  Other Topics Concern  . Not on file  Social History Narrative  . Not on file   Review of Systems Some sinus headache No sore  throat--just irritated from drainage Ribs are sore from the constant cough Trying honey for the cough No diarrhea Only vomiting from the swallowed mucus Has tended to have sinus problems with the change in seasons    Objective:   Physical Exam  Constitutional: She appears well-developed. No distress.  Frequent harsh cough  Neck:  No frontal tenderness when she pushes (but that is the pain spot)  Respiratory: Effort normal. No respiratory distress.  Psychiatric: She has a normal mood and affect. Her behavior is normal.           Assessment & Plan:

## 2018-08-08 DIAGNOSIS — M069 Rheumatoid arthritis, unspecified: Secondary | ICD-10-CM | POA: Diagnosis not present

## 2018-08-08 DIAGNOSIS — M545 Low back pain: Secondary | ICD-10-CM | POA: Diagnosis not present

## 2018-08-08 DIAGNOSIS — G5700 Lesion of sciatic nerve, unspecified lower limb: Secondary | ICD-10-CM | POA: Diagnosis not present

## 2018-08-08 DIAGNOSIS — G894 Chronic pain syndrome: Secondary | ICD-10-CM | POA: Diagnosis not present

## 2018-09-03 MED ORDER — VALACYCLOVIR HCL 1 G PO TABS: 2000.0000 mg | ORAL_TABLET | Freq: Once | ORAL | 1 refills | Status: AC

## 2018-09-09 DIAGNOSIS — G894 Chronic pain syndrome: Secondary | ICD-10-CM | POA: Diagnosis not present

## 2018-09-09 DIAGNOSIS — M069 Rheumatoid arthritis, unspecified: Secondary | ICD-10-CM | POA: Diagnosis not present

## 2018-09-09 DIAGNOSIS — G5700 Lesion of sciatic nerve, unspecified lower limb: Secondary | ICD-10-CM | POA: Diagnosis not present

## 2018-09-09 DIAGNOSIS — M545 Low back pain: Secondary | ICD-10-CM | POA: Diagnosis not present

## 2018-09-09 DIAGNOSIS — Z79899 Other long term (current) drug therapy: Secondary | ICD-10-CM | POA: Diagnosis not present

## 2018-09-09 DIAGNOSIS — Z79891 Long term (current) use of opiate analgesic: Secondary | ICD-10-CM | POA: Diagnosis not present

## 2018-09-30 ENCOUNTER — Encounter: Payer: Self-pay | Admitting: Family Medicine

## 2018-09-30 ENCOUNTER — Other Ambulatory Visit: Payer: Self-pay

## 2018-09-30 ENCOUNTER — Ambulatory Visit (INDEPENDENT_AMBULATORY_CARE_PROVIDER_SITE_OTHER): Payer: BC Managed Care – PPO | Admitting: Family Medicine

## 2018-09-30 VITALS — Temp 99.5°F

## 2018-09-30 DIAGNOSIS — R05 Cough: Secondary | ICD-10-CM

## 2018-09-30 DIAGNOSIS — J309 Allergic rhinitis, unspecified: Secondary | ICD-10-CM | POA: Diagnosis not present

## 2018-09-30 DIAGNOSIS — R059 Cough, unspecified: Secondary | ICD-10-CM

## 2018-09-30 MED ORDER — HYDROCODONE-HOMATROPINE 5-1.5 MG/5ML PO SYRP: 5.0000 mL | ORAL_SOLUTION | Freq: Every evening | ORAL | 0 refills | Status: DC | PRN

## 2018-09-30 MED ORDER — FLUTICASONE PROPIONATE 50 MCG/ACT NA SUSP: 2.0000 | Freq: Every day | NASAL | 6 refills | Status: DC

## 2018-09-30 NOTE — Telephone Encounter (Signed)
I spoke with pt; pt have prod cough with yellow phlegm;H/A and pressure behind rt eye along with other symptoms listed in my chart note. No fever, chills,SOB,muscle pain,diarrhea and no loss of taste or smell. No travel and no known exposure to + covid. Pt has virtual appt this AM at 11:30 with Glenda Chroman FNP

## 2018-09-30 NOTE — Progress Notes (Signed)
Virtual Visit via Video Note  I connected with Debra Gibbs on 09/30/18 at 11:30 AM EDT by a video enabled telemedicine application and verified that I am speaking with the correct person using two identifiers.  Location: Patient: In her home Provider: [place]   I discussed the limitations of evaluation and management by telemedicine and the availability of in person appointments. The patient expressed understanding and agreed to proceed.  History of Present Illness: This is a 38 year old female, patient of Dr. Silvio Pate, who requests virtual visit today to discuss ongoing respiratory symptoms.  She has had off and on cough and sinus pressure since March, 2020.  She was on Augmentin for acute sinusitis in April.  She felt that symptoms returned shortly after completing antibiotic and prescription cough medicine.  She reports over the weekend she has had increased nasal congestion and sinus pressure under her eyes, right greater than left.  She has postnasal drainage.  Her nasal drainage is generally thick and clear with occasional yellow coloring.  She does not feel short of breath or hear any wheezing.  She has not run a fever.  She has no concerning exposures for coronavirus.  She has intermittent coughing spasms throughout the day, these are often brought on by talking.  She is not taking anything for cough recently.  She reports the [employer] do not work for her.  She does report a history of seasonal allergies in the spring and fall.  For these she typically takes Zyrtec.  She has not taken any Zyrtec recently.  She has used a steroid nasal spray in the past and has had some intermittent nosebleeds with it.  She is intolerant of albuterol inhaler and decongestants as they cause increased anxiety.  Past Medical History:  Diagnosis Date  . Anxiety   . Headache    with pregnancy only  . Rheumatoid arthritis(714.0)   . Vision abnormalities    Past Surgical History:  Procedure  Laterality Date  . WISDOM TOOTH EXTRACTION     Family History  Problem Relation Age of Onset  . Lupus Mother   . Arthritis Father    Social History   Tobacco Use  . Smoking status: Former Smoker    Quit date: 04/10/2005    Years since quitting: 13.4  . Smokeless tobacco: Never Used  Substance Use Topics  . Alcohol use: No    Alcohol/week: 0.0 standard drinks  . Drug use: No      Observations/Objective: The patient is alert and answers questions appropriately.  Visible skin is unremarkable.  She has frequent clearing of throat and sniffling.  No audible wheezing.  Intermittent dry cough is witnessed.  Her mood and affect are appropriate.  Temp 99.5 F (37.5 C)  Wt Readings from Last 3 Encounters:  07/29/18 105 lb (47.6 kg)  05/09/18 103 lb (46.7 kg)  12/11/17 99 lb (44.9 kg)   BP Readings from Last 3 Encounters:  05/09/18 106/70  12/11/17 110/78  12/21/15 108/62    Assessment and Plan: 1. Allergic rhinitis, unspecified seasonality, unspecified trigger -Symptoms most consistent with allergic rhinitis and not bacterial infection -Discussed restarting her daily antihistamine, adding inhaled nasal steroid -Follow-up if no improvement in 4 to 5 days - fluticasone (FLONASE) 50 MCG/ACT nasal spray; Place 2 sprays into both nostrils daily.  Dispense: 16 g; Refill: 6  2. Cough -Likely related to #1 with a cyclical component -Discussed using daytime Delsym and nighttime hydrocodone homatroprine, avoid triggers as much as possible -  HYDROcodone-homatropine (HYCODAN) 5-1.5 MG/5ML syrup; Take 5 mLs by mouth at bedtime as needed for cough.  Dispense: 60 mL; Refill: 0   Clarene Reamer, FNP-BC   [facility] at [facility]  09/30/2018 11:42 AM   Follow Up Instructions: Visit recap sent to patient via my chart   I discussed the assessment and treatment plan with the patient. The patient was provided an opportunity to ask questions and all  were answered. The patient agreed with the plan and demonstrated an understanding of the instructions.   The patient was advised to call back or seek an in-person evaluation if the symptoms worsen or if the condition fails to improve as anticipated.   Elby Beck, FNP

## 2018-10-14 DIAGNOSIS — M545 Low back pain: Secondary | ICD-10-CM | POA: Diagnosis not present

## 2018-10-14 DIAGNOSIS — G5701 Lesion of sciatic nerve, right lower limb: Secondary | ICD-10-CM | POA: Diagnosis not present

## 2018-10-14 DIAGNOSIS — M069 Rheumatoid arthritis, unspecified: Secondary | ICD-10-CM | POA: Diagnosis not present

## 2018-10-14 DIAGNOSIS — M2550 Pain in unspecified joint: Secondary | ICD-10-CM | POA: Diagnosis not present

## 2018-11-04 ENCOUNTER — Encounter: Payer: Self-pay | Admitting: Internal Medicine

## 2018-11-04 ENCOUNTER — Ambulatory Visit (INDEPENDENT_AMBULATORY_CARE_PROVIDER_SITE_OTHER): Payer: BC Managed Care – PPO | Admitting: Internal Medicine

## 2018-11-04 DIAGNOSIS — J0101 Acute recurrent maxillary sinusitis: Secondary | ICD-10-CM

## 2018-11-04 MED ORDER — CLINDAMYCIN HCL 300 MG PO CAPS: 300.0000 mg | ORAL_CAPSULE | Freq: Three times a day (TID) | ORAL | 0 refills | Status: DC

## 2018-11-04 MED ORDER — HYDROCODONE-HOMATROPINE 5-1.5 MG/5ML PO SYRP: 5.0000 mL | ORAL_SOLUTION | Freq: Three times a day (TID) | ORAL | 0 refills | Status: DC | PRN

## 2018-11-04 MED ORDER — PREDNISONE 10 MG PO TABS: ORAL_TABLET | ORAL | 0 refills | Status: DC

## 2018-11-04 NOTE — Progress Notes (Signed)
Virtual Visit via Video Note  I connected with Debra Gibbs on 11/04/18 at 12:15 PM EDT by a video enabled telemedicine application and verified that I am speaking with the correct person using two identifiers.  Location: Patient: [name] Provider: Office   I discussed the limitations of evaluation and management by telemedicine and the availability of in person appointments. The patient expressed understanding and agreed to proceed.  History of Present Illness:  Pt reports headache, sinus pressure, ear fullness, sore throat and cough. This started 4 days ago. The headache is located in her forehead. She describes the pain as pressure. She has right maxillary sinus pressure. She denies ear pain, drainage or decreased hearing. She denies difficulty swallowing. The cough is non productive. She denies fever, chills, nausea or SOB. She is taking Xyzal and Flonase with minimal relief. She has not had sick contacts or exposure to COVID that she is aware of. She reports she has been dealing with this intermittently since March without resolution.   HPI    Review of Systems     Past Medical History:  Diagnosis Date  . Anxiety   . Headache    with pregnancy only  . Rheumatoid arthritis(714.0)   . Vision abnormalities     Family History  Problem Relation Age of Onset  . Lupus Mother   . Arthritis Father     Social History   Socioeconomic History  . Marital status: Married    Spouse name: Not on file  . Number of children: 2  . Years of education: Not on file  . Highest education level: Not on file  Occupational History  . Occupation: waitressing part-time, [employer] in [place]  . Financial resource strain: Not on file  . Food insecurity    Worry: Not on file    Inability: Not on file  . Transportation needs    Medical: Not on file    Non-medical: Not on file  Tobacco Use  . Smoking status: Former Smoker    Quit date: 04/10/2005    Years since quitting:  13.5  . Smokeless tobacco: Never Used  Substance and Sexual Activity  . Alcohol use: No    Alcohol/week: 0.0 standard drinks  . Drug use: No  . Sexual activity: Not Currently    Partners: Male    Birth control/protection: None  Lifestyle  . Physical activity    Days per week: Not on file    Minutes per session: Not on file  . Stress: Not on file  Relationships  . Social Herbalist on phone: Not on file    Gets together: Not on file    Attends religious service: Not on file    Active member of club or organization: Not on file    Attends meetings of clubs or organizations: Not on file    Relationship status: Not on file  . Intimate partner violence    Fear of current or ex partner: Not on file    Emotionally abused: Not on file    Physically abused: Not on file    Forced sexual activity: Not on file  Other Topics Concern  . Not on file  Social History Narrative  . Not on file    Allergies  Allergen Reactions  . Citalopram Hydrobromide Other (See Comments)    REACTION: shakes and made her jaw tight  . Macrobid [Nitrofurantoin] Nausea And Vomiting  . Albuterol Anxiety  Constitutional: Positive headache. Denies fatigue, fever or abrupt weight changes.  HEENT:  Positive facial pain, and sore throat. Denies eye redness, ear pain, ringing in the ears, wax buildup, runny nose or bloody nose. Respiratory: Positive cough. Denies difficulty breathing or shortness of breath.  Cardiovascular: Denies chest pain, chest tightness, palpitations or swelling in the hands or feet.   No other specific complaints in a complete review of systems (except as listed in HPI above).  Objective:    General: Appears her stated age, will appearing in NAD. HEENT: Head: normal shape and size, right maxillary sinus tenderness noted; Eyes: sclera white, no icterus, conjunctiva pink;  Pulmonary/Chest: Normal effortNo respiratory distress.       Assessment & Plan:   Acute Maxillary  Sinusitis  Can use a Neti Pot which can be purchased from your local drug store. Flonase 2 sprays each nostril for 3 days and then as needed. Contineu Xyzal RX for Clindamycin 300 mg TID x 10 days RX for Pred Taper x 6 days RX for Hycodan for cough If worse, consider allergy referral for testing, vs CT sinus  RTC as needed or if symptoms persist. Follow Up Instructions:    I discussed the assessment and treatment plan with the patient. The patient was provided an opportunity to ask questions and all were answered. The patient agreed with the plan and demonstrated an understanding of the instructions.   The patient was advised to call back or seek an in-person evaluation if the symptoms worsen or if the condition fails to improve as anticipated.      Webb Silversmith, NP

## 2018-11-04 NOTE — Patient Instructions (Addendum)
Health Maintenance Due  Topic Date Due  . PAP SMEAR-Modifier  04/15/2017    No flowsheet data found.  Sinusitis, Adult Sinusitis is soreness and swelling (inflammation) of your sinuses. Sinuses are hollow spaces in the bones around your face. They are located:  Around your eyes.  In the middle of your forehead.  Behind your nose.  In your cheekbones. Your sinuses and nasal passages are lined with a fluid called mucus. Mucus drains out of your sinuses. Swelling can trap mucus in your sinuses. This lets germs (bacteria, virus, or fungus) grow, which leads to infection. Most of the time, this condition is caused by a virus. What are the causes? This condition is caused by:  Allergies.  Asthma.  Germs.  Things that block your nose or sinuses.  Growths in the nose (nasal polyps).  Chemicals or irritants in the air.  Fungus (rare). What increases the risk? You are more likely to develop this condition if:  You have a weak body defense system (immune system).  You do a lot of swimming or diving.  You use nasal sprays too much.  You smoke. What are the signs or symptoms? The main symptoms of this condition are pain and a feeling of pressure around the sinuses. Other symptoms include:  Stuffy nose (congestion).  Runny nose (drainage).  Swelling and warmth in the sinuses.  Headache.  Toothache.  A cough that may get worse at night.  Mucus that collects in the throat or the back of the nose (postnasal drip).  Being unable to smell and taste.  Being very tired (fatigue).  A fever.  Sore throat.  Bad breath. How is this diagnosed? This condition is diagnosed based on:  Your symptoms.  Your medical history.  A physical exam.  Tests to find out if your condition is short-term (acute) or long-term (chronic). Your doctor may: ? Check your nose for growths (polyps). ? Check your sinuses using a tool that has a light (endoscope). ? Check for allergies or  germs. ? Do imaging tests, such as an MRI or CT scan. How is this treated? Treatment for this condition depends on the cause and whether it is short-term or long-term.  If caused by a virus, your symptoms should go away on their own within 10 days. You may be given medicines to relieve symptoms. They include: ? Medicines that shrink swollen tissue in the nose. ? Medicines that treat allergies (antihistamines). ? A spray that treats swelling of the nostrils. ? Rinses that help get rid of thick mucus in your nose (nasal saline washes).  If caused by bacteria, your doctor may wait to see if you will get better without treatment. You may be given antibiotic medicine if you have: ? A very bad infection. ? A weak body defense system.  If caused by growths in the nose, you may need to have surgery. Follow these instructions at home: Medicines  Take, use, or apply over-the-counter and prescription medicines only as told by your doctor. These may include nasal sprays.  If you were prescribed an antibiotic medicine, take it as told by your doctor. Do not stop taking the antibiotic even if you start to feel better. Hydrate and humidify   Drink enough water to keep your pee (urine) pale yellow.  Use a cool mist humidifier to keep the humidity level in your home above 50%.  Breathe in steam for 10-15 minutes, 3-4 times a day, or as told by your doctor. You can do  this in the bathroom while a hot shower is running.  Try not to spend time in cool or dry air. Rest  Rest as much as you can.  Sleep with your head raised (elevated).  Make sure you get enough sleep each night. General instructions   Put a warm, moist washcloth on your face 3-4 times a day, or as often as told by your doctor. This will help with discomfort.  Wash your hands often with soap and water. If there is no soap and water, use hand sanitizer.  Do not smoke. Avoid being around people who are smoking (secondhand  smoke).  Keep all follow-up visits as told by your doctor. This is important. Contact a doctor if:  You have a fever.  Your symptoms get worse.  Your symptoms do not get better within 10 days. Get help right away if:  You have a very bad headache.  You cannot stop throwing up (vomiting).  You have very bad pain or swelling around your face or eyes.  You have trouble seeing.  You feel confused.  Your neck is stiff.  You have trouble breathing. Summary  Sinusitis is swelling of your sinuses. Sinuses are hollow spaces in the bones around your face.  This condition is caused by tissues in your nose that become inflamed or swollen. This traps germs. These can lead to infection.  If you were prescribed an antibiotic medicine, take it as told by your doctor. Do not stop taking it even if you start to feel better.  Keep all follow-up visits as told by your doctor. This is important. This information is not intended to replace advice given to you by your health care provider. Make sure you discuss any questions you have with your health care provider. Document Released: 09/13/2007 Document Revised: 08/27/2017 Document Reviewed: 08/27/2017 Elsevier Patient Education  2020 [employer].

## 2018-11-12 ENCOUNTER — Other Ambulatory Visit: Payer: Self-pay | Admitting: Internal Medicine

## 2018-11-13 ENCOUNTER — Encounter: Payer: Self-pay | Admitting: Internal Medicine

## 2018-11-28 DIAGNOSIS — M25519 Pain in unspecified shoulder: Secondary | ICD-10-CM | POA: Diagnosis not present

## 2018-11-28 DIAGNOSIS — G5700 Lesion of sciatic nerve, unspecified lower limb: Secondary | ICD-10-CM | POA: Diagnosis not present

## 2018-11-28 DIAGNOSIS — Z79891 Long term (current) use of opiate analgesic: Secondary | ICD-10-CM | POA: Diagnosis not present

## 2018-11-28 DIAGNOSIS — M545 Low back pain: Secondary | ICD-10-CM | POA: Diagnosis not present

## 2018-11-28 DIAGNOSIS — G894 Chronic pain syndrome: Secondary | ICD-10-CM | POA: Diagnosis not present

## 2018-11-28 DIAGNOSIS — Z79899 Other long term (current) drug therapy: Secondary | ICD-10-CM | POA: Diagnosis not present

## 2018-11-28 DIAGNOSIS — M069 Rheumatoid arthritis, unspecified: Secondary | ICD-10-CM | POA: Diagnosis not present

## 2018-12-02 MED ORDER — SULFAMETHOXAZOLE-TRIMETHOPRIM 800-160 MG PO TABS: 1.0000 | ORAL_TABLET | Freq: Two times a day (BID) | ORAL | 0 refills | Status: DC

## 2018-12-04 ENCOUNTER — Encounter: Payer: Self-pay | Admitting: Internal Medicine

## 2018-12-04 ENCOUNTER — Ambulatory Visit (INDEPENDENT_AMBULATORY_CARE_PROVIDER_SITE_OTHER): Payer: BC Managed Care – PPO | Admitting: Internal Medicine

## 2018-12-04 DIAGNOSIS — J324 Chronic pansinusitis: Secondary | ICD-10-CM

## 2018-12-04 MED ORDER — FLOVENT HFA 44 MCG/ACT IN AERO: 1.0000 | INHALATION_SPRAY | Freq: Two times a day (BID) | RESPIRATORY_TRACT | 12 refills | Status: DC

## 2018-12-04 MED ORDER — HYDROCODONE-HOMATROPINE 5-1.5 MG/5ML PO SYRP: 5.0000 mL | ORAL_SOLUTION | Freq: Three times a day (TID) | ORAL | 0 refills | Status: DC | PRN

## 2018-12-04 MED ORDER — BENZONATATE 200 MG PO CAPS: 200.0000 mg | ORAL_CAPSULE | Freq: Three times a day (TID) | ORAL | 0 refills | Status: DC | PRN

## 2018-12-04 NOTE — Progress Notes (Signed)
Virtual Visit via Video Note  I connected with Debra Gibbs on 12/04/18 at  3:45 PM EDT by a video enabled telemedicine application and verified that I am speaking with the correct person using two identifiers.  Location: Patient: Home Provider: Office   I discussed the limitations of evaluation and management by telemedicine and the availability of in person appointments. The patient expressed understanding and agreed to proceed.  HPI  Pt presents to the clinic today with c/o persistent sinus headache, sinus pressure, nasal congestion and cough. This has been an ongoing issue since March. She was recently treated with Prednisone, Clarithromycin and she takes Zyrtec daily. She reports minor improvement in symptoms. She is requesting referral to ENT for further evaluation.  Review of Systems     Past Medical History:  Diagnosis Date  . Anxiety   . Headache    with pregnancy only  . Rheumatoid arthritis(714.0)   . Vision abnormalities     Family History  Problem Relation Age of Onset  . Lupus Mother   . Arthritis Father     Social History   Socioeconomic History  . Marital status: Married    Spouse name: Not on file  . Number of children: 2  . Years of education: Not on file  . Highest education level: Not on file  Occupational History  . Occupation: waitressing part-time, [employer] in [place]  . Financial resource strain: Not on file  . Food insecurity    Worry: Not on file    Inability: Not on file  . Transportation needs    Medical: Not on file    Non-medical: Not on file  Tobacco Use  . Smoking status: Former Smoker    Quit date: 04/10/2005    Years since quitting: 13.6  . Smokeless tobacco: Never Used  Substance and Sexual Activity  . Alcohol use: No    Alcohol/week: 0.0 standard drinks  . Drug use: No  . Sexual activity: Not Currently    Partners: Male    Birth control/protection: None  Lifestyle  . Physical activity    Days per week:  Not on file    Minutes per session: Not on file  . Stress: Not on file  Relationships  . Social Herbalist on phone: Not on file    Gets together: Not on file    Attends religious service: Not on file    Active member of club or organization: Not on file    Attends meetings of clubs or organizations: Not on file    Relationship status: Not on file  . Intimate partner violence    Fear of current or ex partner: Not on file    Emotionally abused: Not on file    Physically abused: Not on file    Forced sexual activity: Not on file  Other Topics Concern  . Not on file  Social History Narrative  . Not on file    Allergies  Allergen Reactions  . Citalopram Hydrobromide Other (See Comments)    REACTION: shakes and made her jaw tight  . Macrobid [Nitrofurantoin] Nausea And Vomiting  . Albuterol Anxiety     Constitutional: Positive headache. Denies fatigue, fever or abrupt weight changes.  HEENT:  Positive facial pain, nasal congestion. Denies eye redness, ear pain, ringing in the ears, wax buildup, runny nose or sore throat. Respiratory:  Denies cough, difficulty breathing or shortness of breath.  Cardiovascular: Denies chest pain, chest tightness, palpitations or  swelling in the hands or feet.   No other specific complaints in a complete review of systems (except as listed in HPI above).  Objective:    General: Appears her stated age, in NAD. HEENT: Head: normal shape and size Pulmonary/Chest: Normal effort No respiratory distress. N      Assessment & Plan:   Recurrent Sinusitis:  Continue Zyrtec in am Add Allegra in pm RX for Flovent 44 mcg 1 puff BID RX for Tessalon Pearls 200 mg TID prn RX for Hycodan for cough Referral to ENT placed  RTC as needed or if symptoms persist. Webb Silversmith, NP    Follow Up Instructions:    I discussed the assessment and treatment plan with the patient. The patient was provided an opportunity to ask questions and all  were answered. The patient agreed with the plan and demonstrated an understanding of the instructions.   The patient was advised to call back or seek an in-person evaluation if the symptoms worsen or if the condition fails to improve as anticipated.    Webb Silversmith, NP

## 2018-12-04 NOTE — Patient Instructions (Signed)

## 2018-12-12 ENCOUNTER — Telehealth: Payer: Self-pay

## 2018-12-12 NOTE — Telephone Encounter (Signed)
Left message for patient to call back to discuss referral.

## 2018-12-31 ENCOUNTER — Encounter: Payer: Self-pay | Admitting: Internal Medicine

## 2018-12-31 ENCOUNTER — Other Ambulatory Visit: Payer: Self-pay | Admitting: Internal Medicine

## 2018-12-31 MED ORDER — BENZONATATE 200 MG PO CAPS: 200.0000 mg | ORAL_CAPSULE | Freq: Three times a day (TID) | ORAL | 0 refills | Status: DC | PRN

## 2018-12-31 NOTE — Telephone Encounter (Signed)
Pt sent a MyChart message to [facility] so I forwarded the refill requests that goes along with it to [facility].

## 2018-12-31 NOTE — Telephone Encounter (Signed)
Please let her know I refilled the benzonatate--but can't do the narcotic cough syrup since she has recently got another narcotic prescription from another doctor

## 2018-12-31 NOTE — Telephone Encounter (Signed)
Message sent via MyChart message she already has open.

## 2019-01-01 ENCOUNTER — Encounter: Payer: Self-pay | Admitting: Internal Medicine

## 2019-01-01 ENCOUNTER — Ambulatory Visit (INDEPENDENT_AMBULATORY_CARE_PROVIDER_SITE_OTHER): Payer: BC Managed Care – PPO | Admitting: Internal Medicine

## 2019-01-01 DIAGNOSIS — J0141 Acute recurrent pansinusitis: Secondary | ICD-10-CM

## 2019-01-01 MED ORDER — PREDNISONE 10 MG PO TABS: ORAL_TABLET | ORAL | 0 refills | Status: DC

## 2019-01-01 MED ORDER — HYDROCODONE-HOMATROPINE 5-1.5 MG/5ML PO SYRP: 5.0000 mL | ORAL_SOLUTION | Freq: Three times a day (TID) | ORAL | 0 refills | Status: DC | PRN

## 2019-01-01 NOTE — Progress Notes (Signed)
Virtual Visit via Video Note  I connected with Debra Gibbs on 01/01/19 at  4:00 PM EDT by a video enabled telemedicine application and verified that I am speaking with the correct person using two identifiers.  Location: Patient: Home Provider: Office   I discussed the limitations of evaluation and management by telemedicine and the availability of in person appointments. The patient expressed understanding and agreed to proceed.  History of Present Illness:   Pt reports persistent right side facial pressure, runny nose, nasal congestion, cough and hoarseness. She reports this has been going on for 3 months. She is blowing clear/yellow mucous out of her nose. The cough is productive of yellow mucous. She denies headache, ear pain, sore throat or shortness of breath. She denies fever, chills or body aches. She was seen 10/2018 for the same, treated with Clarithromycin and Prednisone. She was sen 11/2018 for the same, advised to start Allegra in addition to Zyrtec, Flonase. Flovent was added which she has not used. She was also given RX for [employer] and Hycodan. She report symptoms will improve while on meds then reoccur when she is off meds. She has been referred to ENT but does not have an appt until next Tuesday. She has not had sick contacts that she is aware of.  HPI    Review of Systems     Past Medical History:  Diagnosis Date  . Anxiety   . Headache    with pregnancy only  . Rheumatoid arthritis(714.0)   . Vision abnormalities     Family History  Problem Relation Age of Onset  . Lupus Mother   . Arthritis Father     Social History   Socioeconomic History  . Marital status: Married    Spouse name: Not on file  . Number of children: 2  . Years of education: Not on file  . Highest education level: Not on file  Occupational History  . Occupation: waitressing part-time, [employer] in [place]  . Financial resource strain: Not on file  . Food insecurity   Worry: Not on file    Inability: Not on file  . Transportation needs    Medical: Not on file    Non-medical: Not on file  Tobacco Use  . Smoking status: Former Smoker    Quit date: 04/10/2005    Years since quitting: 13.7  . Smokeless tobacco: Never Used  Substance and Sexual Activity  . Alcohol use: No    Alcohol/week: 0.0 standard drinks  . Drug use: No  . Sexual activity: Not Currently    Partners: Male    Birth control/protection: None  Lifestyle  . Physical activity    Days per week: Not on file    Minutes per session: Not on file  . Stress: Not on file  Relationships  . Social Herbalist on phone: Not on file    Gets together: Not on file    Attends religious service: Not on file    Active member of club or organization: Not on file    Attends meetings of clubs or organizations: Not on file    Relationship status: Not on file  . Intimate partner violence    Fear of current or ex partner: Not on file    Emotionally abused: Not on file    Physically abused: Not on file    Forced sexual activity: Not on file  Other Topics Concern  . Not on file  Social History  Narrative  . Not on file    Allergies  Allergen Reactions  . Citalopram Hydrobromide Other (See Comments)    REACTION: shakes and made her jaw tight  . Macrobid [Nitrofurantoin] Nausea And Vomiting  . Albuterol Anxiety     Constitutional: Denies headache, fatigue, fever or abrupt weight changes.  HEENT:  Positive  facial pain, nasal congestion and runny nose. Denies eye redness, ear pain, ringing in the ears, wax buildup, or sore throat. Respiratory: Positive cough. Denies difficulty breathing or shortness of breath.  Cardiovascular: Denies chest pain, chest tightness, palpitations or swelling in the hands or feet.   No other specific complaints in a complete review of systems (except as listed in HPI above).  Objective:    General: Appears herstated age, well developed, well nourished in  NAD. Pulmonary/Chest: Normal effort, with dry hacking cough. No respiratory distress. No audible wheezing noted.      Assessment & Plan:   Recurrent Pansinusitis:  Can use a Neti Pot which can be purchased from your local drug store. Flonase 2 sprays each nostril for 3 days and then as needed. Continue Allegra and Zyrtec Encouraged her to try to use the Flovent RX for Pred Taper x 6 days Hold off on abx for now [employer] refilled- avoid use with your prn Norco Follow up with ENT next week  RTC as needed or if symptoms persist.  Follow Up Instructions:    I discussed the assessment and treatment plan with the patient. The patient was provided an opportunity to ask questions and all were answered. The patient agreed with the plan and demonstrated an understanding of the instructions.   The patient was advised to call back or seek an in-person evaluation if the symptoms worsen or if the condition fails to improve as anticipated.    Webb Silversmith, NP

## 2019-01-01 NOTE — Patient Instructions (Signed)

## 2019-01-02 DIAGNOSIS — G5700 Lesion of sciatic nerve, unspecified lower limb: Secondary | ICD-10-CM | POA: Diagnosis not present

## 2019-01-02 DIAGNOSIS — M069 Rheumatoid arthritis, unspecified: Secondary | ICD-10-CM | POA: Diagnosis not present

## 2019-01-02 DIAGNOSIS — M545 Low back pain: Secondary | ICD-10-CM | POA: Diagnosis not present

## 2019-01-02 DIAGNOSIS — G894 Chronic pain syndrome: Secondary | ICD-10-CM | POA: Diagnosis not present

## 2019-01-07 DIAGNOSIS — J300 Vasomotor rhinitis: Secondary | ICD-10-CM | POA: Diagnosis not present

## 2019-01-07 DIAGNOSIS — J329 Chronic sinusitis, unspecified: Secondary | ICD-10-CM | POA: Diagnosis not present

## 2019-01-07 DIAGNOSIS — H6993 Unspecified Eustachian tube disorder, bilateral: Secondary | ICD-10-CM | POA: Diagnosis not present

## 2019-01-23 ENCOUNTER — Encounter: Payer: Self-pay | Admitting: Internal Medicine

## 2019-01-23 MED ORDER — LEVOCETIRIZINE DIHYDROCHLORIDE 5 MG PO TABS: 5.0000 mg | ORAL_TABLET | Freq: Every evening | ORAL | 0 refills | Status: DC

## 2019-02-27 DIAGNOSIS — Z79891 Long term (current) use of opiate analgesic: Secondary | ICD-10-CM | POA: Diagnosis not present

## 2019-02-27 DIAGNOSIS — G894 Chronic pain syndrome: Secondary | ICD-10-CM | POA: Diagnosis not present

## 2019-02-27 DIAGNOSIS — Z79899 Other long term (current) drug therapy: Secondary | ICD-10-CM | POA: Diagnosis not present

## 2019-02-27 DIAGNOSIS — M069 Rheumatoid arthritis, unspecified: Secondary | ICD-10-CM | POA: Diagnosis not present

## 2019-02-27 DIAGNOSIS — M545 Low back pain: Secondary | ICD-10-CM | POA: Diagnosis not present

## 2019-02-27 DIAGNOSIS — G5700 Lesion of sciatic nerve, unspecified lower limb: Secondary | ICD-10-CM | POA: Diagnosis not present

## 2019-03-27 ENCOUNTER — Ambulatory Visit: Payer: BC Managed Care – PPO | Admitting: Internal Medicine

## 2019-03-28 ENCOUNTER — Ambulatory Visit (INDEPENDENT_AMBULATORY_CARE_PROVIDER_SITE_OTHER): Payer: BC Managed Care – PPO | Admitting: Internal Medicine

## 2019-03-28 ENCOUNTER — Encounter: Payer: Self-pay | Admitting: Internal Medicine

## 2019-03-28 VITALS — Temp 98.7°F

## 2019-03-28 DIAGNOSIS — J0141 Acute recurrent pansinusitis: Secondary | ICD-10-CM

## 2019-03-28 MED ORDER — PREDNISONE 10 MG PO TABS: ORAL_TABLET | ORAL | 0 refills | Status: DC

## 2019-03-28 MED ORDER — DOXYCYCLINE HYCLATE 100 MG PO TABS: 100.0000 mg | ORAL_TABLET | Freq: Two times a day (BID) | ORAL | 0 refills | Status: DC

## 2019-03-28 MED ORDER — HYDROCODONE-HOMATROPINE 5-1.5 MG/5ML PO SYRP: 5.0000 mL | ORAL_SOLUTION | Freq: Three times a day (TID) | ORAL | 0 refills | Status: DC | PRN

## 2019-03-28 NOTE — Patient Instructions (Signed)

## 2019-03-28 NOTE — Progress Notes (Signed)
Virtual Visit via Video Note  I connected with Debra Gibbs on 03/28/19 at  2:00 PM EST by a video enabled telemedicine application and verified that I am speaking with the correct person using two identifiers.  Location: Patient: [name] Provider: Office   I discussed the limitations of evaluation and management by telemedicine and the availability of in person appointments. The patient expressed understanding and agreed to proceed.  History of Present Illness:  Pt reports sinus pain and pressure, ear fullness, ear pain, nasal congestion and cough. This started 1 week ago. She denies loss of hearing, or drainage from the ear. She is not blowing anything out of her nose. The cough is productive of yellow mucous. She denies fever, chills or body aches. She has tried allergy meds and Delsym with minimal relief. She has not been exposed to COVID that she is aware of. She has a history of chronic sinusitis. She has seen ENT in the past. They wanted to try allergy medications before considering surgery.   Past Medical History:  Diagnosis Date  . Anxiety   . Headache    with pregnancy only  . Rheumatoid arthritis(714.0)   . Vision abnormalities     Current Outpatient Medications  Medication Sig Dispense Refill  . fluticasone (FLONASE) 50 MCG/ACT nasal spray Place 2 sprays into both nostrils daily. 16 g 6  . fluticasone (FLOVENT HFA) 44 MCG/ACT inhaler Inhale 1 puff into the lungs 2 (two) times daily. 1 Inhaler 12  . HYDROcodone-acetaminophen (NORCO) 7.5-325 MG tablet     . ibuprofen (ADVIL) 800 MG tablet     . levocetirizine (XYZAL) 5 MG tablet Take 1 tablet (5 mg total) by mouth every evening. 90 tablet 0  . Multiple Vitamin (MULTIVITAMIN) tablet Take 1 tablet by mouth daily.    . pregabalin (LYRICA) 50 MG capsule     . doxycycline (VIBRA-TABS) 100 MG tablet Take 1 tablet (100 mg total) by mouth 2 (two) times daily. 20 tablet 0  . HYDROcodone-homatropine (HYCODAN) 5-1.5 MG/5ML syrup  Take 5 mLs by mouth every 8 (eight) hours as needed for cough. 120 mL 0  . predniSONE (DELTASONE) 10 MG tablet Take 3 tabs on days 1-2, take 2 tabs on days 3-4, take 1 tab on days 5-6 12 tablet 0   No current facility-administered medications for this visit.    Allergies  Allergen Reactions  . Citalopram Hydrobromide Other (See Comments)    REACTION: shakes and made her jaw tight  . Macrobid [Nitrofurantoin] Nausea And Vomiting  . Albuterol Anxiety    Family History  Problem Relation Age of Onset  . Lupus Mother   . Arthritis Father     Social History   Socioeconomic History  . Marital status: Married    Spouse name: Not on file  . Number of children: 2  . Years of education: Not on file  . Highest education level: Not on file  Occupational History  . Occupation: waitressing part-time, [employer] in [place]  Tobacco Use  . Smoking status: Former Smoker    Quit date: 04/10/2005    Years since quitting: 13.9  . Smokeless tobacco: Never Used  Substance and Sexual Activity  . Alcohol use: No    Alcohol/week: 0.0 standard drinks  . Drug use: No  . Sexual activity: Not Currently    Partners: Male    Birth control/protection: None  Other Topics Concern  . Not on file  Social History Narrative  . Not on file  Social Determinants of Health   Financial Resource Strain:   . Difficulty of Paying Living Expenses: Not on file  Food Insecurity:   . Worried About Charity fundraiser in the Last Year: Not on file  . Ran Out of Food in the Last Year: Not on file  Transportation Needs:   . Lack of Transportation (Medical): Not on file  . Lack of Transportation (Non-Medical): Not on file  Physical Activity:   . Days of Exercise per Week: Not on file  . Minutes of Exercise per Session: Not on file  Stress:   . Feeling of Stress : Not on file  Social Connections:   . Frequency of Communication with Friends and Family: Not on file  . Frequency of Social Gatherings with Friends  and Family: Not on file  . Attends Religious Services: Not on file  . Active Member of Clubs or Organizations: Not on file  . Attends Archivist Meetings: Not on file  . Marital Status: Not on file  Intimate Partner Violence:   . Fear of Current or Ex-Partner: Not on file  . Emotionally Abused: Not on file  . Physically Abused: Not on file  . Sexually Abused: Not on file     Constitutional: Denies fever, malaise, fatigue, headache or abrupt weight changes.  HEENT: Pt reports sinus pain and pressure, nasal congestion, ear pain. Denies eye pain, eye redness,  ringing in the ears, wax buildup, runny nose, bloody nose, or sore throat. Respiratory: Pt reports cough. Denies difficulty breathing, shortness of breath,.   Cardiovascular: Denies chest pain, chest tightness, palpitations or swelling in the hands or feet.   No other specific complaints in a complete review of systems (except as listed in HPI above).  Observations/Objective:  Temp 98.7 F (37.1 C) (Oral)  Wt Readings from Last 3 Encounters:  07/29/18 105 lb (47.6 kg)  05/09/18 103 lb (46.7 kg)  12/11/17 99 lb (44.9 kg)    General: Appears her stated age, well developed, well nourished in NAD. HEENT: Nose: sounds congested; Throat/Mouth: sounds hoarse Pulmonary/Chest: Normal effort. Dry cough noted throughout conversation. No respiratory distress.  Neurological: Alert and oriented. Cranial nerves II-XII grossly intact. Coordination normal.    BMET    Component Value Date/Time   NA 139 04/19/2012 0813   K 3.7 04/19/2012 0813   CL 105 04/19/2012 0813   CO2 27 04/19/2012 0813   GLUCOSE 92 04/19/2012 0813   BUN 10 04/19/2012 0813   CREATININE 0.7 04/19/2012 0813   CALCIUM 9.1 04/19/2012 0813   GFRNONAA 91.51 03/01/2009 1038   GFRAA 112 02/04/2008 0953    Lipid Panel     Component Value Date/Time   CHOL 208 (H) 04/19/2012 0813   TRIG 35.0 04/19/2012 0813   HDL 72 (A) 06/08/2014 0000   CHOLHDL 4  04/19/2012 0813   VLDL 7.0 04/19/2012 0813   LDLCALC 235 06/08/2014 0000    CBC    Component Value Date/Time   WBC 5.8 01/06/2015 1459   RBC 4.45 01/06/2015 1459   HGB 12.0 01/06/2015 1459   HCT 36.4 01/06/2015 1459   PLT 294 01/06/2015 1459   MCV 81.8 01/06/2015 1459   MCV 94.2 07/27/2012 1246   MCH 27.0 01/06/2015 1459   MCHC 33.0 01/06/2015 1459   RDW 17.6 (H) 01/06/2015 1459   LYMPHSABS 2.4 01/06/2015 1459   MONOABS 0.5 01/06/2015 1459   EOSABS 0.3 01/06/2015 1459   BASOSABS 0.1 01/06/2015 1459    Hgb A1C  No results found for: HGBA1C     Assessment and Plan:  Recurrent Pansinusitis:  Continue allergy meds prescribed by ENT RX for Pred Taper x 6 days RX for Doxycycline 100 mg BID x 10 days RX for Hycodan for cough Follow up with ENT if symptoms persist or worsen  Follow Up Instructions:    I discussed the assessment and treatment plan with the patient. The patient was provided an opportunity to ask questions and all were answered. The patient agreed with the plan and demonstrated an understanding of the instructions.   The patient was advised to call back or seek an in-person evaluation if the symptoms worsen or if the condition fails to improve as anticipated.     Webb Silversmith, NP

## 2019-04-30 ENCOUNTER — Encounter: Payer: Self-pay | Admitting: Internal Medicine

## 2019-04-30 ENCOUNTER — Other Ambulatory Visit: Payer: Self-pay

## 2019-04-30 ENCOUNTER — Ambulatory Visit (INDEPENDENT_AMBULATORY_CARE_PROVIDER_SITE_OTHER): Payer: BC Managed Care – PPO | Admitting: Internal Medicine

## 2019-04-30 DIAGNOSIS — J0141 Acute recurrent pansinusitis: Secondary | ICD-10-CM | POA: Diagnosis not present

## 2019-04-30 MED ORDER — FLOVENT HFA 44 MCG/ACT IN AERO: 1.0000 | INHALATION_SPRAY | Freq: Two times a day (BID) | RESPIRATORY_TRACT | 12 refills | Status: DC

## 2019-04-30 MED ORDER — HYDROCODONE-HOMATROPINE 5-1.5 MG/5ML PO SYRP: 5.0000 mL | ORAL_SOLUTION | Freq: Three times a day (TID) | ORAL | 0 refills | Status: DC | PRN

## 2019-04-30 MED ORDER — AZELASTINE HCL 0.1 % NA SOLN: 1.0000 | Freq: Two times a day (BID) | NASAL | 12 refills | Status: DC

## 2019-04-30 NOTE — Patient Instructions (Signed)

## 2019-04-30 NOTE — Progress Notes (Signed)
Virtual Visit via Video Note  I connected with Debra Gibbs on 04/30/19 at  2:30 PM EST by a video enabled telemedicine application and verified that I am speaking with the correct person using two identifiers.  Location: Patient: In her care Provider: Office   I discussed the limitations of evaluation and management by telemedicine and the availability of in person appointments. The patient expressed understanding and agreed to proceed.  History of Present Illness:  Pt reports sinus pressure, ear pain, sore throat and cough. This has been a recurrent issues since 10/2018. She reports the sinus pressure is located in her forehead and the right side of her face. She describes the ear pain as sore, achy without loss of hearing. She denies difficulty swallowing. The cough is productive of yellow mucous. She denies headaches, runny nose, nasal congestion, loss of taste or smell, or SOB. She denies fever, chills or body aches. She has been on multiple rounds on antibiotics and steriods in the last 6 months. She is taking Xyzal and Delsym daily. She does not like to use nasal spray because it causes her nose to bleed. She is out of the Flovent inhaler and would like a refill of this today. She has seen ENT in the past. They wanted to try allergy meds and if that didn't work, they would get imaging for further evaluation. She has an appt with them, but it is 2 weeks away. No one in her home has similar symptoms.   Past Medical History:  Diagnosis Date  . Anxiety   . Headache    with pregnancy only  . Rheumatoid arthritis(714.0)   . Vision abnormalities     Current Outpatient Medications  Medication Sig Dispense Refill  . doxycycline (VIBRA-TABS) 100 MG tablet Take 1 tablet (100 mg total) by mouth 2 (two) times daily. 20 tablet 0  . fluticasone (FLONASE) 50 MCG/ACT nasal spray Place 2 sprays into both nostrils daily. 16 g 6  . fluticasone (FLOVENT HFA) 44 MCG/ACT inhaler Inhale 1 puff into the  lungs 2 (two) times daily. 1 Inhaler 12  . HYDROcodone-acetaminophen (NORCO) 7.5-325 MG tablet     . HYDROcodone-homatropine (HYCODAN) 5-1.5 MG/5ML syrup Take 5 mLs by mouth every 8 (eight) hours as needed for cough. 120 mL 0  . ibuprofen (ADVIL) 800 MG tablet     . levocetirizine (XYZAL) 5 MG tablet Take 1 tablet (5 mg total) by mouth every evening. 90 tablet 0  . Multiple Vitamin (MULTIVITAMIN) tablet Take 1 tablet by mouth daily.    . predniSONE (DELTASONE) 10 MG tablet Take 3 tabs on days 1-2, take 2 tabs on days 3-4, take 1 tab on days 5-6 12 tablet 0  . pregabalin (LYRICA) 50 MG capsule      No current facility-administered medications for this visit.    Allergies  Allergen Reactions  . Citalopram Hydrobromide Other (See Comments)    REACTION: shakes and made her jaw tight  . Macrobid [Nitrofurantoin] Nausea And Vomiting  . Albuterol Anxiety    Family History  Problem Relation Age of Onset  . Lupus Mother   . Arthritis Father     Social History   Socioeconomic History  . Marital status: Married    Spouse name: Not on file  . Number of children: 2  . Years of education: Not on file  . Highest education level: Not on file  Occupational History  . Occupation: waitressing part-time, [employer] in [place]  Tobacco Use  . Smoking  status: Former Smoker    Quit date: 04/10/2005    Years since quitting: 14.0  . Smokeless tobacco: Never Used  Substance and Sexual Activity  . Alcohol use: No    Alcohol/week: 0.0 standard drinks  . Drug use: No  . Sexual activity: Not Currently    Partners: Male    Birth control/protection: None  Other Topics Concern  . Not on file  Social History Narrative  . Not on file   Social Determinants of Health   Financial Resource Strain:   . Difficulty of Paying Living Expenses: Not on file  Food Insecurity:   . Worried About Charity fundraiser in the Last Year: Not on file  . Ran Out of Food in the Last Year: Not on file  Transportation  Needs:   . Lack of Transportation (Medical): Not on file  . Lack of Transportation (Non-Medical): Not on file  Physical Activity:   . Days of Exercise per Week: Not on file  . Minutes of Exercise per Session: Not on file  Stress:   . Feeling of Stress : Not on file  Social Connections:   . Frequency of Communication with Friends and Family: Not on file  . Frequency of Social Gatherings with Friends and Family: Not on file  . Attends Religious Services: Not on file  . Active Member of Clubs or Organizations: Not on file  . Attends Archivist Meetings: Not on file  . Marital Status: Not on file  Intimate Partner Violence:   . Fear of Current or Ex-Partner: Not on file  . Emotionally Abused: Not on file  . Physically Abused: Not on file  . Sexually Abused: Not on file     Constitutional: Denies fever, malaise, fatigue, headache or abrupt weight changes.  HEENT: Pt reports facial pressure, ear pain. Denies eye pain, eye redness, ringing in the ears, wax buildup, runny nose, nasal congestion, bloody nose, or sore throat. Respiratory: Pt reports cough. Denies difficulty breathing, shortness of breath, or sputum production.   Cardiovascular: Denies chest pain, chest tightness, palpitations or swelling in the hands or feet.   No other specific complaints in a complete review of systems (except as listed in HPI above).  Observations/Objective:   Wt Readings from Last 3 Encounters:  07/29/18 105 lb (47.6 kg)  05/09/18 103 lb (46.7 kg)  12/11/17 99 lb (44.9 kg)    General: Appears her stated age, in NAD. HEENT: Head: normal shape and size; Nose: sounds congested ; Throat/Mouth: sounds mildly hoarse. Pulmonary/Chest: Normal effort. No respiratory distress. Dry cough noted. Neurological: Alert and oriented.   BMET    Component Value Date/Time   NA 139 04/19/2012 0813   K 3.7 04/19/2012 0813   CL 105 04/19/2012 0813   CO2 27 04/19/2012 0813   GLUCOSE 92 04/19/2012 0813    BUN 10 04/19/2012 0813   CREATININE 0.7 04/19/2012 0813   CALCIUM 9.1 04/19/2012 0813   GFRNONAA 91.51 03/01/2009 1038   GFRAA 112 02/04/2008 0953    Lipid Panel     Component Value Date/Time   CHOL 208 (H) 04/19/2012 0813   TRIG 35.0 04/19/2012 0813   HDL 72 (A) 06/08/2014 0000   CHOLHDL 4 04/19/2012 0813   VLDL 7.0 04/19/2012 0813   LDLCALC 235 06/08/2014 0000    CBC    Component Value Date/Time   WBC 5.8 01/06/2015 1459   RBC 4.45 01/06/2015 1459   HGB 12.0 01/06/2015 1459   HCT 36.4 01/06/2015  1459   PLT 294 01/06/2015 1459   MCV 81.8 01/06/2015 1459   MCV 94.2 07/27/2012 1246   MCH 27.0 01/06/2015 1459   MCHC 33.0 01/06/2015 1459   RDW 17.6 (H) 01/06/2015 1459   LYMPHSABS 2.4 01/06/2015 1459   MONOABS 0.5 01/06/2015 1459   EOSABS 0.3 01/06/2015 1459   BASOSABS 0.1 01/06/2015 1459    Hgb A1C No results found for: HGBA1C     Assessment and Plan:  Recurrent Pansinusitis:  Advised her to continue Xyzal RX for Astelin nasal spray Refilled Flovent inhaler Refilled Hycodan Advised her I did not want to use abx at this time due the numerous abx and steroids she has recently been on but advised her I would get with Dr. Silvio Pate and get back with her about this.  Advised her to follow through with her ENT workup.  Return precautions discussed  Follow Up Instructions:    I discussed the assessment and treatment plan with the patient. The patient was provided an opportunity to ask questions and all were answered. The patient agreed with the plan and demonstrated an understanding of the instructions.   The patient was advised to call back or seek an in-person evaluation if the symptoms worsen or if the condition fails to improve as anticipated.     Webb Silversmith, NP

## 2019-05-01 ENCOUNTER — Encounter: Payer: Self-pay | Admitting: Internal Medicine

## 2019-05-06 ENCOUNTER — Ambulatory Visit (INDEPENDENT_AMBULATORY_CARE_PROVIDER_SITE_OTHER): Payer: BC Managed Care – PPO | Admitting: Otolaryngology

## 2019-05-12 DIAGNOSIS — Z79899 Other long term (current) drug therapy: Secondary | ICD-10-CM | POA: Diagnosis not present

## 2019-05-12 DIAGNOSIS — G894 Chronic pain syndrome: Secondary | ICD-10-CM | POA: Diagnosis not present

## 2019-05-12 DIAGNOSIS — M545 Low back pain: Secondary | ICD-10-CM | POA: Diagnosis not present

## 2019-05-12 DIAGNOSIS — M069 Rheumatoid arthritis, unspecified: Secondary | ICD-10-CM | POA: Diagnosis not present

## 2019-05-12 DIAGNOSIS — G5700 Lesion of sciatic nerve, unspecified lower limb: Secondary | ICD-10-CM | POA: Diagnosis not present

## 2019-05-12 DIAGNOSIS — Z79891 Long term (current) use of opiate analgesic: Secondary | ICD-10-CM | POA: Diagnosis not present

## 2019-06-30 ENCOUNTER — Ambulatory Visit: Payer: BC Managed Care – PPO | Admitting: Internal Medicine

## 2019-06-30 MED ORDER — BENZONATATE 200 MG PO CAPS: 200.0000 mg | ORAL_CAPSULE | Freq: Three times a day (TID) | ORAL | 0 refills | Status: DC | PRN

## 2019-06-30 MED ORDER — HYDROCODONE-HOMATROPINE 5-1.5 MG/5ML PO SYRP: 5.0000 mL | ORAL_SOLUTION | Freq: Three times a day (TID) | ORAL | 0 refills | Status: DC | PRN

## 2019-06-30 NOTE — Progress Notes (Signed)
appt cancelled by provider. Pt needs to see PCP.

## 2019-06-30 NOTE — Telephone Encounter (Signed)
Debra Gibbs brought me a note where she had transferred pt to access nurse since pt wanted to talk with Dr Silvio Pate. Access nurse made 3 attempts to call pt and pt did not answer the phone. Pt has already sent a mychart note to Dr Silvio Pate that I have attached to. The access nurse note is not in portal yet. I am unable to contact pt by phone as well so I will send the pt message to Dr Silvio Pate.

## 2019-06-30 NOTE — Telephone Encounter (Signed)
[facility] - Client TELEPHONE ADVICE RECORD [facility] Patient Name: Debra Gibbs Gender: Female DOB: 1980-10-30 Age: 39 Y 19 M Return Phone Number: [phone] (Primary) Address: City/State/Zip: [place] [address] Client [facility] - Client Client Site [facility] - Day Physician Viviana Simpler - MD Contact Type Call Who Is Calling Patient / Member / Family / Caregiver Call Type Triage / Clinical Relationship To Patient Self Return Phone Number [phone] (Primary) Chief Complaint Cough Reason for Call Symptomatic / Request for Health Information Initial Comment Has congestion, cough and sinus pressure. Translation No Nurse Assessment Guidelines Guideline Title Affirmed Question Affirmed Notes Nurse Date/Time (Eastern Time) Disp. Time Eilene Ghazi Time) Disposition Final User 06/30/2019 1:51:30 PM Attempt made - no message left Zorita Pang, RN, Neoma Laming 06/30/2019 2:06:25 PM Attempt made - message left Zorita Pang, RN, Neoma Laming 06/30/2019 2:10:10 PM Send To RN Personal Zorita Pang, RN, [name] 06/30/2019 2:17:30 PM FINAL ATTEMPT MADE - message left Yes Olevia Bowens, RN, Ander Purpura

## 2019-07-02 ENCOUNTER — Other Ambulatory Visit: Payer: Self-pay

## 2019-07-02 ENCOUNTER — Ambulatory Visit: Payer: BC Managed Care – PPO | Admitting: Internal Medicine

## 2019-07-03 DIAGNOSIS — Z79899 Other long term (current) drug therapy: Secondary | ICD-10-CM | POA: Diagnosis not present

## 2019-07-03 DIAGNOSIS — Z79891 Long term (current) use of opiate analgesic: Secondary | ICD-10-CM | POA: Diagnosis not present

## 2019-07-03 DIAGNOSIS — M069 Rheumatoid arthritis, unspecified: Secondary | ICD-10-CM | POA: Diagnosis not present

## 2019-07-03 DIAGNOSIS — G894 Chronic pain syndrome: Secondary | ICD-10-CM | POA: Diagnosis not present

## 2019-07-03 DIAGNOSIS — G5700 Lesion of sciatic nerve, unspecified lower limb: Secondary | ICD-10-CM | POA: Diagnosis not present

## 2019-07-03 DIAGNOSIS — M545 Low back pain: Secondary | ICD-10-CM | POA: Diagnosis not present

## 2019-07-04 ENCOUNTER — Ambulatory Visit: Payer: BC Managed Care – PPO | Admitting: Internal Medicine

## 2019-07-04 ENCOUNTER — Telehealth: Payer: Self-pay

## 2019-07-04 ENCOUNTER — Other Ambulatory Visit: Payer: Self-pay

## 2019-07-04 NOTE — Telephone Encounter (Signed)
Pt was scheduled with Dr Silvio Pate at 12pm today for a Virtual Visit. I called her at 11:59 and left a message that I was calling to set her up for her visit. I did not leave a message the 2nd time. Pt never called back to continue with the visit.   Per Dr Silvio Pate, do not charge No Show fee.

## 2019-07-05 NOTE — Assessment & Plan Note (Signed)
No show for virtual visit twice

## 2019-07-05 NOTE — Progress Notes (Signed)
NO show for visit

## 2019-07-09 ENCOUNTER — Ambulatory Visit (INDEPENDENT_AMBULATORY_CARE_PROVIDER_SITE_OTHER): Payer: BC Managed Care – PPO | Admitting: Otolaryngology

## 2019-07-31 DIAGNOSIS — G5700 Lesion of sciatic nerve, unspecified lower limb: Secondary | ICD-10-CM | POA: Diagnosis not present

## 2019-08-21 ENCOUNTER — Telehealth (INDEPENDENT_AMBULATORY_CARE_PROVIDER_SITE_OTHER): Payer: BC Managed Care – PPO | Admitting: Internal Medicine

## 2019-08-21 ENCOUNTER — Encounter: Payer: Self-pay | Admitting: Internal Medicine

## 2019-08-21 DIAGNOSIS — J4521 Mild intermittent asthma with (acute) exacerbation: Secondary | ICD-10-CM

## 2019-08-21 DIAGNOSIS — J0111 Acute recurrent frontal sinusitis: Secondary | ICD-10-CM | POA: Diagnosis not present

## 2019-08-21 DIAGNOSIS — J45909 Unspecified asthma, uncomplicated: Secondary | ICD-10-CM | POA: Insufficient documentation

## 2019-08-21 MED ORDER — HYDROCODONE-HOMATROPINE 5-1.5 MG/5ML PO SYRP: 5.0000 mL | ORAL_SOLUTION | Freq: Three times a day (TID) | ORAL | 0 refills | Status: DC | PRN

## 2019-08-21 MED ORDER — AMOXICILLIN-POT CLAVULANATE 875-125 MG PO TABS: 1.0000 | ORAL_TABLET | Freq: Two times a day (BID) | ORAL | 1 refills | Status: DC

## 2019-08-21 MED ORDER — MONTELUKAST SODIUM 10 MG PO TABS
10.0000 mg | ORAL_TABLET | Freq: Every day | ORAL | 3 refills | Status: DC
Start: 2019-08-21 — End: 2019-12-17

## 2019-08-21 MED ORDER — BENZONATATE 200 MG PO CAPS
200.0000 mg | ORAL_CAPSULE | Freq: Three times a day (TID) | ORAL | 0 refills | Status: DC | PRN
Start: 2019-08-21 — End: 2019-11-27

## 2019-08-21 NOTE — Progress Notes (Signed)
Subjective:    Patient ID: Debra Gibbs, female    DOB: 04/14/1980, 39 y.o.   MRN: [id number]  HPI  Video virtual visit due to respiratory symptoms again Identification done Reviewed limitations and billing and she gave consent Participants--patient in her home and I am in my office  Having sinus symptoms again--but now on both sides Worse on right side and ear--that was always the worst side Has drainage and cough--bad all night Talking will cause cough and gagging (and sometimes vomiting) Constant post nasal drip and film in her mouth  Did see ENT in the past Suggested surgery for right maxillary recurrent obstruction--but she declined at that point  Not really in her chest--but she can hear it there Not wheezing but some noise Not SOB-but deep breath will cause a cough On flovent--but doesn't seem to be working as well Is on zyrtec now also  Current Outpatient Medications on File Prior to Visit  Medication Sig Dispense Refill  . azelastine (ASTELIN) 0.1 % nasal spray Place 1 spray into both nostrils 2 (two) times daily. Use in each nostril as directed 30 mL 12  . fluticasone (FLOVENT HFA) 44 MCG/ACT inhaler Inhale 1 puff into the lungs 2 (two) times daily. 1 Inhaler 12  . HYDROcodone-acetaminophen (NORCO) 7.5-325 MG tablet     . ibuprofen (ADVIL) 800 MG tablet     . Multiple Vitamin (MULTIVITAMIN) tablet Take 1 tablet by mouth daily.    . pregabalin (LYRICA) 50 MG capsule     . benzonatate (TESSALON) 200 MG capsule Take 1 capsule (200 mg total) by mouth 3 (three) times daily as needed for cough. (Patient not taking: Reported on 08/21/2019) 60 capsule 0  . HYDROcodone-homatropine (HYCODAN) 5-1.5 MG/5ML syrup Take 5 mLs by mouth every 8 (eight) hours as needed for cough. (Patient not taking: Reported on 08/21/2019) 120 mL 0   No current facility-administered medications on file prior to visit.    Allergies  Allergen Reactions  . Citalopram Hydrobromide Other (See Comments)     REACTION: shakes and made her jaw tight  . Macrobid [Nitrofurantoin] Nausea And Vomiting  . Albuterol Anxiety    Past Medical History:  Diagnosis Date  . Anxiety   . Headache    with pregnancy only  . Rheumatoid arthritis(714.0)   . Vision abnormalities     Past Surgical History:  Procedure Laterality Date  . WISDOM TOOTH EXTRACTION      Family History  Problem Relation Age of Onset  . Lupus Mother   . Arthritis Father     Social History   Socioeconomic History  . Marital status: Married    Spouse name: Not on file  . Number of children: 2  . Years of education: Not on file  . Highest education level: Not on file  Occupational History  . Occupation: waitressing part-time, [employer] in [place]  Tobacco Use  . Smoking status: Former Smoker    Quit date: 04/10/2005    Years since quitting: 14.3  . Smokeless tobacco: Never Used  Substance and Sexual Activity  . Alcohol use: No    Alcohol/week: 0.0 standard drinks  . Drug use: No  . Sexual activity: Not Currently    Partners: Male    Birth control/protection: None  Other Topics Concern  . Not on file  Social History Narrative  . Not on file   Social Determinants of Health   Financial Resource Strain:   . Difficulty of Paying Living Expenses:   Food  Insecurity:   . Worried About Charity fundraiser in the Last Year:   . Arboriculturist in the Last Year:   Transportation Needs:   . Film/video editor (Medical):   Marland Kitchen Lack of Transportation (Non-Medical):   Physical Activity:   . Days of Exercise per Week:   . Minutes of Exercise per Session:   Stress:   . Feeling of Stress :   Social Connections:   . Frequency of Communication with Friends and Family:   . Frequency of Social Gatherings with Friends and Family:   . Attends Religious Services:   . Active Member of Clubs or Organizations:   . Attends Archivist Meetings:   Marland Kitchen Marital Status:   Intimate Partner Violence:   . Fear of Current  or Ex-Partner:   . Emotionally Abused:   Marland Kitchen Physically Abused:   . Sexually Abused:    Review of Systems No fever Some right ear congestion but not pain No nausea but vomits up mucus at times    Objective:   Physical Exam  Constitutional: She appears well-developed. No distress.  Respiratory:  No distress but persistent cough Not SOB --even with talking persistently  Psychiatric: She has a normal mood and affect. Her behavior is normal.           Assessment & Plan:

## 2019-08-21 NOTE — Assessment & Plan Note (Signed)
Clearly seems to have infection again Will treat with augmentin  Cough meds as well If not improving next week, can try prednisone as well

## 2019-08-21 NOTE — Assessment & Plan Note (Signed)
Is still on the flovent--but some coughing that is probably asthmatic Will add singulair Doesn't like albuterol

## 2019-08-22 MED ORDER — FLUCONAZOLE 150 MG PO TABS: 150.0000 mg | ORAL_TABLET | Freq: Once | ORAL | 1 refills | Status: AC

## 2019-10-09 DIAGNOSIS — M545 Low back pain: Secondary | ICD-10-CM | POA: Diagnosis not present

## 2019-10-09 DIAGNOSIS — Z79891 Long term (current) use of opiate analgesic: Secondary | ICD-10-CM | POA: Diagnosis not present

## 2019-10-09 DIAGNOSIS — M069 Rheumatoid arthritis, unspecified: Secondary | ICD-10-CM | POA: Diagnosis not present

## 2019-10-09 DIAGNOSIS — G5700 Lesion of sciatic nerve, unspecified lower limb: Secondary | ICD-10-CM | POA: Diagnosis not present

## 2019-10-09 DIAGNOSIS — G894 Chronic pain syndrome: Secondary | ICD-10-CM | POA: Diagnosis not present

## 2019-10-09 DIAGNOSIS — Z79899 Other long term (current) drug therapy: Secondary | ICD-10-CM | POA: Diagnosis not present

## 2019-11-11 DIAGNOSIS — F4322 Adjustment disorder with anxiety: Secondary | ICD-10-CM | POA: Diagnosis not present

## 2019-11-12 ENCOUNTER — Ambulatory Visit: Payer: BC Managed Care – PPO | Admitting: Obstetrics

## 2019-11-27 ENCOUNTER — Telehealth (INDEPENDENT_AMBULATORY_CARE_PROVIDER_SITE_OTHER): Payer: BC Managed Care – PPO | Admitting: Internal Medicine

## 2019-11-27 ENCOUNTER — Encounter: Payer: Self-pay | Admitting: Internal Medicine

## 2019-11-27 ENCOUNTER — Other Ambulatory Visit: Payer: Self-pay

## 2019-11-27 DIAGNOSIS — F439 Reaction to severe stress, unspecified: Secondary | ICD-10-CM | POA: Diagnosis not present

## 2019-11-27 DIAGNOSIS — J4521 Mild intermittent asthma with (acute) exacerbation: Secondary | ICD-10-CM

## 2019-11-27 MED ORDER — AMOXICILLIN-POT CLAVULANATE 875-125 MG PO TABS: 1.0000 | ORAL_TABLET | Freq: Two times a day (BID) | ORAL | 1 refills | Status: DC

## 2019-11-27 MED ORDER — FLOVENT HFA 44 MCG/ACT IN AERO: 1.0000 | INHALATION_SPRAY | Freq: Two times a day (BID) | RESPIRATORY_TRACT | 6 refills | Status: DC

## 2019-11-27 MED ORDER — LORAZEPAM 0.5 MG PO TABS
0.5000 mg | ORAL_TABLET | Freq: Two times a day (BID) | ORAL | 0 refills | Status: DC | PRN
Start: 2019-11-27 — End: 2021-03-30

## 2019-11-27 MED ORDER — BENZONATATE 200 MG PO CAPS: 200.0000 mg | ORAL_CAPSULE | Freq: Three times a day (TID) | ORAL | 0 refills | Status: DC | PRN

## 2019-11-27 MED ORDER — FLUCONAZOLE 150 MG PO TABS: 150.0000 mg | ORAL_TABLET | Freq: Once | ORAL | 1 refills | Status: AC

## 2019-11-27 MED ORDER — HYDROCODONE-HOMATROPINE 5-1.5 MG/5ML PO SYRP: 5.0000 mL | ORAL_SOLUTION | Freq: Three times a day (TID) | ORAL | 0 refills | Status: DC | PRN

## 2019-11-27 NOTE — Progress Notes (Signed)
Subjective:    Patient ID: Debra Gibbs, female    DOB: July 06, 1980, 39 y.o.   MRN: [id number]  HPI Video virtual visit due to anxiety and ongoing sinus issues Identification done Reviewed limitations and billing Participants---patient in her car and I am in my office  Recently separated Had "been coming" but still tough Not sleeping together for 5 months The fighting got really bad recently--no physical abuse She left with kids---staying with dad.  Kids restarting sports, etc  "I have been feeling a lot of pressure, nausea, mind going" Not sleeping well  Cough has just restarted again about a week ago Will even gag and have yellow mucus Blowing her nose--then pressure in head this morning  Does feel sad--about marriage "I am just spinning--can't function" Main issue is not being able to sleep  Current Outpatient Medications on File Prior to Visit  Medication Sig Dispense Refill  . azelastine (ASTELIN) 0.1 % nasal spray Place 1 spray into both nostrils 2 (two) times daily. Use in each nostril as directed 30 mL 12  . fluticasone (FLOVENT HFA) 44 MCG/ACT inhaler Inhale 1 puff into the lungs 2 (two) times daily. 1 Inhaler 12  . HYDROcodone-acetaminophen (NORCO) 7.5-325 MG tablet     . ibuprofen (ADVIL) 800 MG tablet     . montelukast (SINGULAIR) 10 MG tablet Take 1 tablet (10 mg total) by mouth at bedtime. 90 tablet 3  . Multiple Vitamin (MULTIVITAMIN) tablet Take 1 tablet by mouth daily.    . pregabalin (LYRICA) 50 MG capsule      No current facility-administered medications on file prior to visit.    Allergies  Allergen Reactions  . Citalopram Hydrobromide Other (See Comments)    REACTION: shakes and made her jaw tight  . Macrobid [Nitrofurantoin] Nausea And Vomiting  . Albuterol Anxiety    Past Medical History:  Diagnosis Date  . Anxiety   . Headache    with pregnancy only  . Rheumatoid arthritis(714.0)   . Vision abnormalities     Past Surgical History:    Procedure Laterality Date  . WISDOM TOOTH EXTRACTION      Family History  Problem Relation Age of Onset  . Lupus Mother   . Arthritis Father     Social History   Socioeconomic History  . Marital status: Legally Separated    Spouse name: Not on file  . Number of children: 2  . Years of education: Not on file  . Highest education level: Not on file  Occupational History  . Occupation: waitressing part-time, [employer] in [place]  Tobacco Use  . Smoking status: Former Smoker    Quit date: 04/10/2005    Years since quitting: 14.6  . Smokeless tobacco: Never Used  Substance and Sexual Activity  . Alcohol use: No    Alcohol/week: 0.0 standard drinks  . Drug use: No  . Sexual activity: Not Currently    Partners: Male    Birth control/protection: None  Other Topics Concern  . Not on file  Social History Narrative  . Not on file   Social Determinants of Health   Financial Resource Strain:   . Difficulty of Paying Living Expenses: Not on file  Food Insecurity:   . Worried About Charity fundraiser in the Last Year: Not on file  . Ran Out of Food in the Last Year: Not on file  Transportation Needs:   . Lack of Transportation (Medical): Not on file  . Lack of Transportation (Non-Medical):  Not on file  Physical Activity:   . Days of Exercise per Week: Not on file  . Minutes of Exercise per Session: Not on file  Stress:   . Feeling of Stress : Not on file  Social Connections:   . Frequency of Communication with Friends and Family: Not on file  . Frequency of Social Gatherings with Friends and Family: Not on file  . Attends Religious Services: Not on file  . Active Member of Clubs or Organizations: Not on file  . Attends Archivist Meetings: Not on file  . Marital Status: Not on file  Intimate Partner Violence:   . Fear of Current or Ex-Partner: Not on file  . Emotionally Abused: Not on file  . Physically Abused: Not on file  . Sexually Abused: Not on file    Review of Systems No history of depression Not eating well Missed 1//2 day of work this week No weight loss    Objective:   Physical Exam Constitutional:      Comments: Fairly constant cough  Psychiatric:     Comments: Normal appearance and speech No clear depression            Assessment & Plan:

## 2019-11-27 NOTE — Assessment & Plan Note (Signed)
With sinus symptoms again Will refill the flovent---asked her to stay on this due to recurrent symptoms augmentin again for sinus (with fluconazole prn) Hydrocodone cough syrup and benzonatate

## 2019-11-27 NOTE — Assessment & Plan Note (Signed)
Going through separation Having a hard time---especially sleeping Trying to work through process with husband---but sounds like marriage is over Has support from father and children will be able to stay in same school,etc Not really depressed  Will give lorazepam for nerves/sleep She will let me know if things worsen Recheck 3-4 weeks

## 2019-11-28 ENCOUNTER — Telehealth: Payer: Self-pay | Admitting: Internal Medicine

## 2019-11-28 ENCOUNTER — Telehealth: Payer: Self-pay

## 2019-11-28 MED ORDER — PULMICORT FLEXHALER 90 MCG/ACT IN AEPB: 1.0000 | INHALATION_SPRAY | Freq: Two times a day (BID) | RESPIRATORY_TRACT | 5 refills | Status: DC

## 2019-11-28 NOTE — Telephone Encounter (Signed)
Received a fax from [facility] stating pt's insurance does not cover Flovent inhaler without a PA. It does cover albuterol HFA, Advair, Arnuity Ellipta, Flovent Diskus, Symbicort, Pulmicort Flexhaler, Breo Ellita, and Asmanex.  Will forward to Dr Silvio Pate to decide if it needs to be changed.

## 2019-11-28 NOTE — Telephone Encounter (Signed)
Send Rx for pulmicort flexihaler 360 mcg inhaled bid (with 5 months refills)

## 2019-11-28 NOTE — Telephone Encounter (Signed)
Spoke to pt. Sent new rx.

## 2019-11-28 NOTE — Telephone Encounter (Signed)
I left a detailed message on patient's voice mail.  Dr.Letvak's requesting to please set up follow up in 3-4 weeks----in office is preferable (I need to check her lungs).

## 2019-12-04 DIAGNOSIS — M069 Rheumatoid arthritis, unspecified: Secondary | ICD-10-CM | POA: Diagnosis not present

## 2019-12-04 DIAGNOSIS — G894 Chronic pain syndrome: Secondary | ICD-10-CM | POA: Diagnosis not present

## 2019-12-04 DIAGNOSIS — M545 Low back pain: Secondary | ICD-10-CM | POA: Diagnosis not present

## 2019-12-04 DIAGNOSIS — Z79891 Long term (current) use of opiate analgesic: Secondary | ICD-10-CM | POA: Diagnosis not present

## 2019-12-04 DIAGNOSIS — Z79899 Other long term (current) drug therapy: Secondary | ICD-10-CM | POA: Diagnosis not present

## 2019-12-04 DIAGNOSIS — G5700 Lesion of sciatic nerve, unspecified lower limb: Secondary | ICD-10-CM | POA: Diagnosis not present

## 2019-12-17 ENCOUNTER — Other Ambulatory Visit: Payer: Self-pay

## 2019-12-17 ENCOUNTER — Ambulatory Visit: Payer: BC Managed Care – PPO | Admitting: Internal Medicine

## 2019-12-17 ENCOUNTER — Encounter: Payer: Self-pay | Admitting: Internal Medicine

## 2019-12-17 DIAGNOSIS — J4521 Mild intermittent asthma with (acute) exacerbation: Secondary | ICD-10-CM

## 2019-12-17 DIAGNOSIS — J0141 Acute recurrent pansinusitis: Secondary | ICD-10-CM | POA: Diagnosis not present

## 2019-12-17 DIAGNOSIS — F439 Reaction to severe stress, unspecified: Secondary | ICD-10-CM

## 2019-12-17 MED ORDER — MONTELUKAST SODIUM 10 MG PO TABS: 10.0000 mg | ORAL_TABLET | Freq: Every day | ORAL | 3 refills | Status: DC

## 2019-12-17 MED ORDER — PREDNISONE 20 MG PO TABS: 40.0000 mg | ORAL_TABLET | Freq: Every day | ORAL | 0 refills | Status: DC

## 2019-12-17 MED ORDER — AMOXICILLIN-POT CLAVULANATE 875-125 MG PO TABS: 1.0000 | ORAL_TABLET | Freq: Two times a day (BID) | ORAL | 0 refills | Status: DC

## 2019-12-17 NOTE — Assessment & Plan Note (Signed)
Dealing with the separation a little better Has the lorazepam--but hasn't needed much Not depressed---will hold off on other medication

## 2019-12-17 NOTE — Progress Notes (Signed)
Subjective:    Patient ID: Debra Gibbs, female    DOB: 1980/09/22, 39 y.o.   MRN: [id number]  HPI Here for follow up of severe situational stress This visit occurred during the SARS-CoV-2 public health emergency.  Safety protocols were in place, including screening questions prior to the visit, additional usage of staff PPE, and extensive cleaning of exam room while observing appropriate contact time as indicated for disinfecting solutions.   Still separating--but getting along--"for the kids" Staying with dad Kids are in their same school Has only used the lorazepam twice  Using [place]---natural med that does help calm her Not really depressed---"things are tough, but I am " dealing with it  Current Outpatient Medications on File Prior to Visit  Medication Sig Dispense Refill  . benzonatate (TESSALON) 200 MG capsule Take 1 capsule (200 mg total) by mouth 3 (three) times daily as needed for cough. 60 capsule 0  . Budesonide (PULMICORT FLEXHALER) 90 MCG/ACT inhaler Inhale 1 puff into the lungs 2 (two) times daily. 1 each 5  . HYDROcodone-acetaminophen (NORCO) 7.5-325 MG tablet     . HYDROcodone-homatropine (HYCODAN) 5-1.5 MG/5ML syrup Take 5 mLs by mouth every 8 (eight) hours as needed for cough. 120 mL 0  . ibuprofen (ADVIL) 800 MG tablet     . LORazepam (ATIVAN) 0.5 MG tablet Take 1 tablet (0.5 mg total) by mouth 2 (two) times daily as needed for anxiety or sleep. 60 tablet 0  . montelukast (SINGULAIR) 10 MG tablet Take 1 tablet (10 mg total) by mouth at bedtime. 90 tablet 3  . Multiple Vitamin (MULTIVITAMIN) tablet Take 1 tablet by mouth daily.    . pregabalin (LYRICA) 50 MG capsule      No current facility-administered medications on file prior to visit.    Allergies  Allergen Reactions  . Citalopram Hydrobromide Other (See Comments)    REACTION: shakes and made her jaw tight  . Macrobid [Nitrofurantoin] Nausea And Vomiting  . Albuterol Anxiety    Past Medical  History:  Diagnosis Date  . Anxiety   . Headache    with pregnancy only  . Rheumatoid arthritis(714.0)   . Vision abnormalities     Past Surgical History:  Procedure Laterality Date  . WISDOM TOOTH EXTRACTION      Family History  Problem Relation Age of Onset  . Lupus Mother   . Arthritis Father     Social History   Socioeconomic History  . Marital status: Legally Separated    Spouse name: Not on file  . Number of children: 2  . Years of education: Not on file  . Highest education level: Not on file  Occupational History  . Occupation: waitressing part-time, [employer] in [place]  Tobacco Use  . Smoking status: Former Smoker    Quit date: 04/10/2005    Years since quitting: 14.6  . Smokeless tobacco: Never Used  Substance and Sexual Activity  . Alcohol use: No    Alcohol/week: 0.0 standard drinks  . Drug use: No  . Sexual activity: Not Currently    Partners: Male    Birth control/protection: None  Other Topics Concern  . Not on file  Social History Narrative  . Not on file   Social Determinants of Health   Financial Resource Strain:   . Difficulty of Paying Living Expenses: Not on file  Food Insecurity:   . Worried About Charity fundraiser in the Last Year: Not on file  . Ran Out of Food  in the Last Year: Not on file  Transportation Needs:   . Lack of Transportation (Medical): Not on file  . Lack of Transportation (Non-Medical): Not on file  Physical Activity:   . Days of Exercise per Week: Not on file  . Minutes of Exercise per Session: Not on file  Stress:   . Feeling of Stress : Not on file  Social Connections:   . Frequency of Communication with Friends and Family: Not on file  . Frequency of Social Gatherings with Friends and Family: Not on file  . Attends Religious Services: Not on file  . Active Member of Clubs or Organizations: Not on file  . Attends Archivist Meetings: Not on file  . Marital Status: Not on file  Intimate Partner  Violence:   . Fear of Current or Ex-Partner: Not on file  . Emotionally Abused: Not on file  . Physically Abused: Not on file  . Sexually Abused: Not on file   Review of Systems  Had bee sleeping till respiratory symptoms recently recurred Did calm down with the antibiotic Having recurrent right frontal pressure--but not in right ear yet Taking pulmicort regularly---no SOB (other than with coughing fit) Does have PND and coughs up stuff Hasn't been taking the singulair Past epistaxis with flonase--hasn't used it in a while Does use xyzal    Objective:   Physical Exam Constitutional:      Appearance: Normal appearance.     Comments: Persistent cough  HENT:     Head:     Comments: Right maxillary and bilateral frontal tenderness     Right Ear: Tympanic membrane, ear canal and external ear normal.     Left Ear: Tympanic membrane, ear canal and external ear normal.     Nose: Congestion present.     Mouth/Throat:     Pharynx: No oropharyngeal exudate or posterior oropharyngeal erythema.  Pulmonary:     Effort: Pulmonary effort is normal.     Breath sounds: Normal breath sounds. No wheezing or rales.  Neurological:     Mental Status: She is alert.  Psychiatric:        Mood and Affect: Mood normal.        Behavior: Behavior normal.            Assessment & Plan:

## 2019-12-17 NOTE — Assessment & Plan Note (Signed)
Needs the singulair daily Not tight--but will give 3 days of prednisone Discussed flonase and xyzal

## 2019-12-17 NOTE — Assessment & Plan Note (Signed)
Will try augmentin again--for 2 weeks Consider evaluation by ENT

## 2019-12-17 NOTE — Patient Instructions (Signed)
Please take the antibiotic for 2 weeks. Take the singulair every night Restart the flonase and continue daily xyzal If you have ongoing problems, ENT evaluation is the next step

## 2019-12-18 DIAGNOSIS — Z79899 Other long term (current) drug therapy: Secondary | ICD-10-CM | POA: Diagnosis not present

## 2019-12-18 DIAGNOSIS — M069 Rheumatoid arthritis, unspecified: Secondary | ICD-10-CM | POA: Diagnosis not present

## 2019-12-18 DIAGNOSIS — M545 Low back pain: Secondary | ICD-10-CM | POA: Diagnosis not present

## 2019-12-18 DIAGNOSIS — Z79891 Long term (current) use of opiate analgesic: Secondary | ICD-10-CM | POA: Diagnosis not present

## 2019-12-18 DIAGNOSIS — G894 Chronic pain syndrome: Secondary | ICD-10-CM | POA: Diagnosis not present

## 2019-12-18 DIAGNOSIS — G5700 Lesion of sciatic nerve, unspecified lower limb: Secondary | ICD-10-CM | POA: Diagnosis not present

## 2020-01-01 DIAGNOSIS — M069 Rheumatoid arthritis, unspecified: Secondary | ICD-10-CM | POA: Diagnosis not present

## 2020-01-01 DIAGNOSIS — M545 Low back pain: Secondary | ICD-10-CM | POA: Diagnosis not present

## 2020-01-01 DIAGNOSIS — G894 Chronic pain syndrome: Secondary | ICD-10-CM | POA: Diagnosis not present

## 2020-01-01 DIAGNOSIS — Z79899 Other long term (current) drug therapy: Secondary | ICD-10-CM | POA: Diagnosis not present

## 2020-01-01 DIAGNOSIS — G5700 Lesion of sciatic nerve, unspecified lower limb: Secondary | ICD-10-CM | POA: Diagnosis not present

## 2020-01-01 DIAGNOSIS — Z79891 Long term (current) use of opiate analgesic: Secondary | ICD-10-CM | POA: Diagnosis not present

## 2020-01-08 DIAGNOSIS — M069 Rheumatoid arthritis, unspecified: Secondary | ICD-10-CM | POA: Diagnosis not present

## 2020-01-08 DIAGNOSIS — M25519 Pain in unspecified shoulder: Secondary | ICD-10-CM | POA: Diagnosis not present

## 2020-01-08 DIAGNOSIS — G5700 Lesion of sciatic nerve, unspecified lower limb: Secondary | ICD-10-CM | POA: Diagnosis not present

## 2020-01-08 DIAGNOSIS — G894 Chronic pain syndrome: Secondary | ICD-10-CM | POA: Diagnosis not present

## 2020-01-29 DIAGNOSIS — M069 Rheumatoid arthritis, unspecified: Secondary | ICD-10-CM | POA: Diagnosis not present

## 2020-01-29 DIAGNOSIS — G894 Chronic pain syndrome: Secondary | ICD-10-CM | POA: Diagnosis not present

## 2020-01-29 DIAGNOSIS — M5459 Other low back pain: Secondary | ICD-10-CM | POA: Diagnosis not present

## 2020-01-29 DIAGNOSIS — Z79899 Other long term (current) drug therapy: Secondary | ICD-10-CM | POA: Diagnosis not present

## 2020-01-29 DIAGNOSIS — G5700 Lesion of sciatic nerve, unspecified lower limb: Secondary | ICD-10-CM | POA: Diagnosis not present

## 2020-01-29 DIAGNOSIS — Z79891 Long term (current) use of opiate analgesic: Secondary | ICD-10-CM | POA: Diagnosis not present

## 2020-03-09 ENCOUNTER — Other Ambulatory Visit: Payer: Self-pay

## 2020-03-09 ENCOUNTER — Encounter: Payer: Self-pay | Admitting: Internal Medicine

## 2020-03-09 ENCOUNTER — Ambulatory Visit: Payer: BC Managed Care – PPO | Admitting: Internal Medicine

## 2020-03-09 VITALS — BP 110/60 | HR 90 | Temp 98.0°F | Ht 62.0 in | Wt 97.8 lb

## 2020-03-09 DIAGNOSIS — J4520 Mild intermittent asthma, uncomplicated: Secondary | ICD-10-CM | POA: Diagnosis not present

## 2020-03-09 DIAGNOSIS — K2100 Gastro-esophageal reflux disease with esophagitis, without bleeding: Secondary | ICD-10-CM | POA: Diagnosis not present

## 2020-03-09 LAB — COMPREHENSIVE METABOLIC PANEL
ALT: 54 U/L — ABNORMAL HIGH (ref 0–35)
AST: 58 U/L — ABNORMAL HIGH (ref 0–37)
Albumin: 4.5 g/dL (ref 3.5–5.2)
Alkaline Phosphatase: 70 U/L (ref 39–117)
BUN: 8 mg/dL (ref 6–23)
CO2: 30 mEq/L (ref 19–32)
Calcium: 9.5 mg/dL (ref 8.4–10.5)
Chloride: 100 mEq/L (ref 96–112)
Creatinine, Ser: 0.65 mg/dL (ref 0.40–1.20)
GFR: 112.07 mL/min (ref >60.00)
Glucose, Bld: 71 mg/dL (ref 70–99)
Potassium: 4.2 mEq/L (ref 3.5–5.1)
Sodium: 137 mEq/L (ref 135–145)
Total Bilirubin: 0.6 mg/dL (ref 0.2–1.2)
Total Protein: 7.5 g/dL (ref 6.0–8.3)

## 2020-03-09 LAB — CBC
HCT: 41.5 % (ref 36.0–46.0)
Hemoglobin: 14.2 g/dL (ref 12.0–15.0)
MCHC: 34.3 g/dL (ref 30.0–36.0)
MCV: 102.1 fl — ABNORMAL HIGH (ref 78.0–100.0)
Platelets: 278 10*3/uL (ref 150.0–400.0)
RBC: 4.06 Mil/uL (ref 3.87–5.11)
RDW: 12.6 % (ref 11.5–15.5)
WBC: 5.6 10*3/uL (ref 4.0–10.5)

## 2020-03-09 LAB — LIPID PANEL
Cholesterol: 214 mg/dL — ABNORMAL HIGH (ref 0–200)
HDL: 71.3 mg/dL (ref >39.00)
LDL Cholesterol: 130 mg/dL — ABNORMAL HIGH (ref 0–99)
NonHDL: 142.91
Total CHOL/HDL Ratio: 3
Triglycerides: 67 mg/dL (ref 0.0–149.0)
VLDL: 13.4 mg/dL (ref 0.0–40.0)

## 2020-03-09 LAB — SEDIMENTATION RATE: Sed Rate: 7 mm/hr (ref 0–20)

## 2020-03-09 MED ORDER — OMEPRAZOLE 20 MG PO CPDR: 20.0000 mg | DELAYED_RELEASE_CAPSULE | Freq: Two times a day (BID) | ORAL | 11 refills | Status: DC

## 2020-03-09 NOTE — Assessment & Plan Note (Signed)
Clear symptoms of acid damage with dysphagia Will start omeprazole bid and cut back to daily if symptoms resolve If no improvement in 3-4 weeks, will need GI evaluation  Feeling of knot is lower than stomach--doubt recurrence of H pylori If symptoms persist, will need GI for EGD

## 2020-03-09 NOTE — Progress Notes (Signed)
Subjective:    Patient ID: Debra Gibbs, female    DOB: 21-Sep-1980, 39 y.o.   MRN: [id number]  HPI Here with stomach/swallowing issues This visit occurred during the SARS-CoV-2 public health emergency.  Safety protocols were in place, including screening questions prior to the visit, additional usage of staff PPE, and extensive cleaning of exam room while observing appropriate contact time as indicated for disinfecting solutions.   Still has off and on respiratory issues Has stabilized  Has the feeling of a "ball" by her xiphoid--for a year Will have gagging--?related to this Notices when eating---"it gets lodged" in her throat (points at larynx)--this goes back 8 months or so She feels these things are related Gets regular heartburn ---tums 3 times a week or so  Does seem to cause cough, respiratory symptoms No smoking in many years 1 coke a day--no other caffeine occ gum Doesn't eat close to bedtime  Was treated for H pylori in the past  Still living with dad (with her kids) Separated---not sure what will happen  Current Outpatient Medications on File Prior to Visit  Medication Sig Dispense Refill  . Budesonide (PULMICORT FLEXHALER) 90 MCG/ACT inhaler Inhale 1 puff into the lungs 2 (two) times daily. 1 each 5  . HYDROcodone-acetaminophen (NORCO) 7.5-325 MG tablet     . ibuprofen (ADVIL) 800 MG tablet     . LORazepam (ATIVAN) 0.5 MG tablet Take 1 tablet (0.5 mg total) by mouth 2 (two) times daily as needed for anxiety or sleep. 60 tablet 0  . montelukast (SINGULAIR) 10 MG tablet Take 1 tablet (10 mg total) by mouth at bedtime. 90 tablet 3  . Multiple Vitamin (MULTIVITAMIN) tablet Take 1 tablet by mouth daily.    . pregabalin (LYRICA) 50 MG capsule      No current facility-administered medications on file prior to visit.    Allergies  Allergen Reactions  . Citalopram Hydrobromide Other (See Comments)    REACTION: shakes and made her jaw tight  . Macrobid  [Nitrofurantoin] Nausea And Vomiting  . Albuterol Anxiety    Past Medical History:  Diagnosis Date  . Anxiety   . Headache    with pregnancy only  . Rheumatoid arthritis(714.0)   . Vision abnormalities     Past Surgical History:  Procedure Laterality Date  . WISDOM TOOTH EXTRACTION      Family History  Problem Relation Age of Onset  . Lupus Mother   . Arthritis Father     Social History   Socioeconomic History  . Marital status: Legally Separated    Spouse name: Not on file  . Number of children: 2  . Years of education: Not on file  . Highest education level: Not on file  Occupational History  . Occupation: waitressing part-time, [employer] in [place]  Tobacco Use  . Smoking status: Former Smoker    Quit date: 04/10/2005    Years since quitting: 14.9  . Smokeless tobacco: Never Used  Substance and Sexual Activity  . Alcohol use: No    Alcohol/week: 0.0 standard drinks  . Drug use: No  . Sexual activity: Not Currently    Partners: Male    Birth control/protection: None  Other Topics Concern  . Not on file  Social History Narrative  . Not on file   Social Determinants of Health   Financial Resource Strain:   . Difficulty of Paying Living Expenses: Not on file  Food Insecurity:   . Worried About Charity fundraiser in  the Last Year: Not on file  . Ran Out of Food in the Last Year: Not on file  Transportation Needs:   . Lack of Transportation (Medical): Not on file  . Lack of Transportation (Non-Medical): Not on file  Physical Activity:   . Days of Exercise per Week: Not on file  . Minutes of Exercise per Session: Not on file  Stress:   . Feeling of Stress : Not on file  Social Connections:   . Frequency of Communication with Friends and Family: Not on file  . Frequency of Social Gatherings with Friends and Family: Not on file  . Attends Religious Services: Not on file  . Active Member of Clubs or Organizations: Not on file  . Attends Theatre manager Meetings: Not on file  . Marital Status: Not on file  Intimate Partner Violence:   . Fear of Current or Ex-Partner: Not on file  . Emotionally Abused: Not on file  . Physically Abused: Not on file  . Sexually Abused: Not on file   Review of Systems  Appetite is not great--no major change Weight down slightly Persistent nausea---vomits from gagging twice a week or so Bowels "back and forth"----either loose or "nothing"     Objective:   Physical Exam Constitutional:      Appearance: Normal appearance.  Cardiovascular:     Rate and Rhythm: Normal rate and regular rhythm.     Heart sounds: No murmur heard.  No gallop.   Pulmonary:     Effort: Pulmonary effort is normal.     Breath sounds: Normal breath sounds. No wheezing or rales.  Abdominal:     Palpations: Abdomen is soft. There is no mass.     Comments: Some sensitivity above umbilicus but no mass  Musculoskeletal:     Cervical back: Neck supple.  Lymphadenopathy:     Cervical: No cervical adenopathy.  Neurological:     Mental Status: She is alert.  Psychiatric:        Mood and Affect: Mood normal.        Behavior: Behavior normal.            Assessment & Plan:

## 2020-03-09 NOTE — Patient Instructions (Signed)
Please take the omeprazole 20mg  twice a day on an empty stomach. If your swallowing is not better within 3-4 weeks, I will set you up with a GI specialist. If all your symptoms are gone within 6 weeks, you can cut back on the omeprazole to daily---but continue it ongoing (to prevent asthma problems)

## 2020-03-09 NOTE — Assessment & Plan Note (Signed)
Likely has been exacerbated by the acid reflux Will continue the pulmicort and singulair

## 2020-03-15 DIAGNOSIS — F4322 Adjustment disorder with anxiety: Secondary | ICD-10-CM | POA: Diagnosis not present

## 2020-03-30 DIAGNOSIS — Z79899 Other long term (current) drug therapy: Secondary | ICD-10-CM | POA: Diagnosis not present

## 2020-03-30 DIAGNOSIS — M069 Rheumatoid arthritis, unspecified: Secondary | ICD-10-CM | POA: Diagnosis not present

## 2020-03-30 DIAGNOSIS — G894 Chronic pain syndrome: Secondary | ICD-10-CM | POA: Diagnosis not present

## 2020-03-30 DIAGNOSIS — Z79891 Long term (current) use of opiate analgesic: Secondary | ICD-10-CM | POA: Diagnosis not present

## 2020-03-30 DIAGNOSIS — M25519 Pain in unspecified shoulder: Secondary | ICD-10-CM | POA: Diagnosis not present

## 2020-03-30 DIAGNOSIS — G5700 Lesion of sciatic nerve, unspecified lower limb: Secondary | ICD-10-CM | POA: Diagnosis not present

## 2020-05-18 ENCOUNTER — Other Ambulatory Visit: Payer: Self-pay

## 2020-05-18 ENCOUNTER — Ambulatory Visit: Payer: BC Managed Care – PPO | Admitting: Obstetrics

## 2020-05-18 ENCOUNTER — Encounter: Payer: Self-pay | Admitting: Obstetrics

## 2020-05-18 VITALS — BP 106/81 | HR 88 | Ht 62.0 in | Wt 95.7 lb

## 2020-05-18 DIAGNOSIS — Z30433 Encounter for removal and reinsertion of intrauterine contraceptive device: Secondary | ICD-10-CM | POA: Diagnosis not present

## 2020-05-18 MED ORDER — LEVONORGESTREL 20 MCG/24HR IU IUD
INTRAUTERINE_SYSTEM | Freq: Once | INTRAUTERINE | Status: AC
  Administered 2020-05-18: 1 via INTRAUTERINE

## 2020-05-18 NOTE — Progress Notes (Addendum)
    GYNECOLOGY OFFICE PROCEDURE NOTE  Debra Gibbs is a 40 y.o. [id number] here for MIRENA IUD Removal and Reinsertion. No GYN concerns.  Last pap smear was on 04-15-2016 and was normal.  IUD Removal and Reinsertion  Patient identified, informed consent performed, consent signed.   Discussed risks of irregular bleeding, cramping, infection, malpositioning or misplacement of the IUD outside the uterus which may require further procedures. Also discussed >99% contraception efficacy, increased risk of ectopic pregnancy with failure of method.   Emphasized that this did not protect against STIs, condoms recommended during all sexual encounters.Advised to use backup contraception for one week as the risk of pregnancy is higher during the transition period of removing an IUD and replacing it with another one. Time out was performed. Speculum placed in the vagina. The strings of the IUD were grasped and pulled using long curved dressing forceps. The IUD was successfully removed in its entirety. The cervix was cleaned with Betadine x 3 and grasped anteriorly with a single tooth tenaculum.  The new MIRENA IUD insertion apparatus was used to sound the uterus to 6 cm;  the IUD was then placed per manufacturer's recommendations. Strings trimmed to 3 cm. Tenaculum was removed, good hemostasis noted. Patient tolerated procedure well.   IUD:  MIRENA Lot #:  [id number] Exp. Date:  JUL  2023  Patient was given post-procedure instructions.  She was reminded to have backup contraception for one week during this transition period between IUDs.  Patient was also asked to check IUD strings periodically and follow up in 6 weeks for IUD check.   Shelly Bombard, MD, [facility] 05/18/20

## 2020-05-18 NOTE — Addendum Note (Signed)
Addended by: Baltazar Najjar A on: 05/18/2020 04:04 PM   Modules accepted: Level of Service

## 2020-05-18 NOTE — Progress Notes (Signed)
RGYN patient presents for IUD Removal and reinsertion.   Mirena inserted 2016 .

## 2020-05-27 DIAGNOSIS — Z79899 Other long term (current) drug therapy: Secondary | ICD-10-CM | POA: Diagnosis not present

## 2020-05-27 DIAGNOSIS — G894 Chronic pain syndrome: Secondary | ICD-10-CM | POA: Diagnosis not present

## 2020-05-27 DIAGNOSIS — Z79891 Long term (current) use of opiate analgesic: Secondary | ICD-10-CM | POA: Diagnosis not present

## 2020-05-27 DIAGNOSIS — G5700 Lesion of sciatic nerve, unspecified lower limb: Secondary | ICD-10-CM | POA: Diagnosis not present

## 2020-06-22 DIAGNOSIS — M25519 Pain in unspecified shoulder: Secondary | ICD-10-CM | POA: Diagnosis not present

## 2020-06-22 DIAGNOSIS — M069 Rheumatoid arthritis, unspecified: Secondary | ICD-10-CM | POA: Diagnosis not present

## 2020-06-22 DIAGNOSIS — G894 Chronic pain syndrome: Secondary | ICD-10-CM | POA: Diagnosis not present

## 2020-06-22 DIAGNOSIS — G5700 Lesion of sciatic nerve, unspecified lower limb: Secondary | ICD-10-CM | POA: Diagnosis not present

## 2020-06-29 ENCOUNTER — Ambulatory Visit: Payer: BC Managed Care – PPO | Admitting: Obstetrics

## 2020-07-01 ENCOUNTER — Telehealth: Payer: Self-pay

## 2020-07-01 NOTE — Telephone Encounter (Signed)
[facility] - Client TELEPHONE ADVICE RECORD [facility] Patient Name: Debra Gibbs Gender: Female DOB: 11-Jun-1980 Age: 40 Y 3 M 2 D Return Phone Number: [phone] (Primary) Address: City/State/Zip: [place] [address] Client [facility] - Client Client Site [facility] - Day Physician Viviana Simpler- MD Contact Type Call Who Is Calling Patient / Member / Family / Caregiver Call Type Triage / Clinical Relationship To Patient Self Return Phone Number [phone] (Primary) Chief Complaint CHEST PAIN - pain, pressure, heaviness or tightness Reason for Call Symptomatic / Request for Cal[place] states she is coughing and has wheezing, drainage, and body aches. Caller states she does not have a fever. Caller states she has a lot of rattling in her chest and her chest feels very tight. Translation No Nurse Assessment Nurse: Thad Ranger, RN, Denise Date/Time (Eastern Time): 07/01/2020 2:28:24 PM Confirm and document reason for call. If symptomatic, describe symptoms. ---Caller states she is coughing and has wheezing, drainage, and body aches. Caller states she does not have a fever. Caller states she has a lot of rattling in her chest and her chest feels very tight. Does the patient have any new or worsening symptoms? ---Yes Will a triage be completed? ---Yes Related visit to physician within the last 2 weeks? ---No Does the PT have any chronic conditions? (i.e. diabetes, asthma, this includes High risk factors for pregnancy, etc.) ---No Is the patient pregnant or possibly pregnant? (Ask all females between the ages of 39-55) ---No Is this a behavioral health or substance abuse call? ---No Guidelines Guideline Title Affirmed Question Affirmed Notes Nurse Date/Time (Eastern Time) Common Cold Common cold with no complications Carmon, RN, Langley Gauss 07/01/2020 2:29:27 PM Disp. Time  Eilene Ghazi Time) Disposition Final User 07/01/2020 2:24:56 PM Send to Urgent Queue Avie Arenas 07/01/2020 2:32:41 PM [place], RN, Langley Gauss PLEASE NOTE: All timestamps contained within this report are represented as [place] Standard Time. CONFIDENTIALTY NOTICE: This fax transmission is intended only for the addressee. It contains information that is legally privileged, confidential or otherwise protected from use or disclosure. If you are not the intended recipient, you are strictly prohibited from reviewing, disclosing, copying using or disseminating any of this information or taking any action in reliance on or regarding this information. If you have received this fax in error, please notify us immediately by telephone so that we can arrange for its return to [place]. Phone: [phone], Toll-Free: [phone], Fax: [phone] Page: 2 of 2 Call Id: [id number] [place] Disagree/Comply Comply Caller Understands Yes PreDisposition Call Doctor Care Advice Given Per Guideline HOME CARE: * You should be able to treat this at home. REASSURANCE AND EDUCATION - COMMON COLD SYMPTOMS: * It sounds like an uncomplicated cold that we can treat at home. FOR A RUNNY NOSE - BLOW YOUR NOSE: * Nasal mucus and discharge help wash viruses and bacteria out of the nose and sinuses. * Blowing your nose helps clean out your nose. Use a handkerchief or a paper tissue. * Antihistamines are only helpful if you also have nasal allergies. MEDICINES FOR STUFFY OR RUNNY NOSE: NASAL DECONGESTANTS FOR A VERY STUFFY NOSE: * Pseudoephedrine (Sudafed): Available over-the-counter in pill form. Typical adult dosage is two 30 mg tablets every 6 hours. * If your nose feels blocked, you should try using nasal washes first. TREATMENT FOR OTHER COLD SYMPTOMS: * For muscle aches, headaches, or moderate fever (more than 101 F or 38.3 C): Take  acetaminophen every 4 hours. * Sore throat: Try throat lozenges, hard candy, or  warm chicken broth. * Cough: Use cough drops. * Hydrate: Drink adequate liquids. HUMIDIFIER: * If the air is dry, use a humidifier in the bedroom. CONTAGIOUSNESS: * The cold virus is present in your nasal secretions. * Cover your nose and mouth with a tissue when you sneeze or cough. * Wash your hands frequently with soap and water. CALL BACK IF: * You become short of breath * You become worse CARE ADVICE given per Common Cold (Adult) guideline. Comments User: Romeo Apple, RN Date/Time Eilene Ghazi Time): 07/01/2020 2:29:22 PM covid test neg

## 2020-07-01 NOTE — Telephone Encounter (Signed)
She does get asthma flares In person would be better---but hopefully we can get a successful management plan with the virtual visit

## 2020-07-01 NOTE — Telephone Encounter (Signed)
Per appt notes pt already has video appt with DR Silvio Pate on 07/02/20 at 11am.

## 2020-07-02 ENCOUNTER — Encounter: Payer: Self-pay | Admitting: Internal Medicine

## 2020-07-02 ENCOUNTER — Telehealth (INDEPENDENT_AMBULATORY_CARE_PROVIDER_SITE_OTHER): Payer: BC Managed Care – PPO | Admitting: Internal Medicine

## 2020-07-02 ENCOUNTER — Other Ambulatory Visit: Payer: Self-pay

## 2020-07-02 DIAGNOSIS — J988 Other specified respiratory disorders: Secondary | ICD-10-CM

## 2020-07-02 DIAGNOSIS — B9789 Other viral agents as the cause of diseases classified elsewhere: Secondary | ICD-10-CM | POA: Insufficient documentation

## 2020-07-02 MED ORDER — PREDNISONE 20 MG PO TABS: 40.0000 mg | ORAL_TABLET | Freq: Every day | ORAL | 0 refills | Status: DC

## 2020-07-02 NOTE — Progress Notes (Signed)
Subjective:    Patient ID: Debra Gibbs, female    DOB: 05-Aug-1980, 40 y.o.   MRN: [id number]  HPI Video virtual visit for respiratory symptoms Identification done Reviewed limitations and billing and she gave consent Participants---patient in her home, and I am in my office  Daughter sick earlier in the week She then caught it---daughter was diagnosed with sinus/bronchitis and was treated  Started 2 days ago Feels tight in her chest No energy Everything hurting Bad cough T max 100.1 Yellow nasal drainage--cough still dry Not really wheezing---but hears "crackles" when breathing in No SOB Home COVID negative last night  Current Outpatient Medications on File Prior to Visit  Medication Sig Dispense Refill  . Budesonide (PULMICORT FLEXHALER) 90 MCG/ACT inhaler Inhale 1 puff into the lungs 2 (two) times daily. 1 each 5  . HYDROcodone-acetaminophen (NORCO) 7.5-325 MG tablet     . levonorgestrel (MIRENA) 20 MCG/24HR IUD 1 each by Intrauterine route once.    Marland Kitchen LORazepam (ATIVAN) 0.5 MG tablet Take 1 tablet (0.5 mg total) by mouth 2 (two) times daily as needed for anxiety or sleep. 60 tablet 0  . montelukast (SINGULAIR) 10 MG tablet Take 1 tablet (10 mg total) by mouth at bedtime. 90 tablet 3  . Multiple Vitamin (MULTIVITAMIN) tablet Take 1 tablet by mouth daily.    Marland Kitchen omeprazole (PRILOSEC) 20 MG capsule Take 1 capsule (20 mg total) by mouth 2 (two) times daily before a meal. 60 capsule 11  . pregabalin (LYRICA) 50 MG capsule      No current facility-administered medications on file prior to visit.    Allergies  Allergen Reactions  . Citalopram Hydrobromide Other (See Comments)    REACTION: shakes and made her jaw tight  . Macrobid [Nitrofurantoin] Nausea And Vomiting  . Albuterol Anxiety    Past Medical History:  Diagnosis Date  . Anxiety   . Headache    with pregnancy only  . Rheumatoid arthritis(714.0)   . Vision abnormalities     Past Surgical History:   Procedure Laterality Date  . WISDOM TOOTH EXTRACTION      Family History  Problem Relation Age of Onset  . Lupus Mother   . Arthritis Father     Social History   Socioeconomic History  . Marital status: Legally Separated    Spouse name: Not on file  . Number of children: 2  . Years of education: Not on file  . Highest education level: Not on file  Occupational History  . Occupation: waitressing part-time, [employer] in [place]  Tobacco Use  . Smoking status: Former Smoker    Quit date: 04/10/2005    Years since quitting: 15.2  . Smokeless tobacco: Never Used  Substance and Sexual Activity  . Alcohol use: No    Alcohol/week: 0.0 standard drinks  . Drug use: No  . Sexual activity: Not Currently    Partners: Male    Birth control/protection: None, I.U.D.  Other Topics Concern  . Not on file  Social History Narrative  . Not on file   Social Determinants of Health   Financial Resource Strain: Not on file  Food Insecurity: Not on file  Transportation Needs: Not on file  Physical Activity: Not on file  Stress: Not on file  Social Connections: Not on file  Intimate Partner Violence: Not on file   Review of Systems No sore throat Some frontal/parietal headache No N/V Able to eat okay Is out of work     Objective:  Physical Exam Constitutional:      General: She is not in acute distress. Pulmonary:     Effort: Pulmonary effort is normal. No respiratory distress.     Comments: occ coarse cough Neurological:     Mental Status: She is alert.            Assessment & Plan:

## 2020-07-02 NOTE — Assessment & Plan Note (Signed)
Seems to have classic viral respiratory infection with low grade fever, aches, etc Discussed that this is unlikely to be bacterial Discussed supportive care Will treat with 5 days of prednisone If sinus symptoms worsen in the next few days---would try empiric amoxicillin

## 2020-07-22 DIAGNOSIS — Z79899 Other long term (current) drug therapy: Secondary | ICD-10-CM | POA: Diagnosis not present

## 2020-07-22 DIAGNOSIS — Z79891 Long term (current) use of opiate analgesic: Secondary | ICD-10-CM | POA: Diagnosis not present

## 2020-07-22 DIAGNOSIS — G5701 Lesion of sciatic nerve, right lower limb: Secondary | ICD-10-CM | POA: Diagnosis not present

## 2020-07-22 DIAGNOSIS — M2550 Pain in unspecified joint: Secondary | ICD-10-CM | POA: Diagnosis not present

## 2020-07-22 DIAGNOSIS — G894 Chronic pain syndrome: Secondary | ICD-10-CM | POA: Diagnosis not present

## 2020-07-22 DIAGNOSIS — M069 Rheumatoid arthritis, unspecified: Secondary | ICD-10-CM | POA: Diagnosis not present

## 2020-07-22 DIAGNOSIS — M79651 Pain in right thigh: Secondary | ICD-10-CM | POA: Diagnosis not present

## 2020-08-26 ENCOUNTER — Encounter: Payer: Self-pay | Admitting: Family Medicine

## 2020-08-26 ENCOUNTER — Telehealth (INDEPENDENT_AMBULATORY_CARE_PROVIDER_SITE_OTHER): Payer: BC Managed Care – PPO | Admitting: Family Medicine

## 2020-08-26 VITALS — Ht 62.0 in | Wt 100.0 lb

## 2020-08-26 DIAGNOSIS — J101 Influenza due to other identified influenza virus with other respiratory manifestations: Secondary | ICD-10-CM

## 2020-08-26 MED ORDER — OSELTAMIVIR PHOSPHATE 75 MG PO CAPS: 75.0000 mg | ORAL_CAPSULE | Freq: Two times a day (BID) | ORAL | 0 refills | Status: AC

## 2020-08-26 MED ORDER — HYDROCOD POLST-CPM POLST ER 10-8 MG/5ML PO SUER: 5.0000 mL | Freq: Two times a day (BID) | ORAL | 0 refills | Status: DC | PRN

## 2020-08-26 NOTE — Progress Notes (Signed)
I connected with Danel Studzinski on 08/26/20 at 10:40 AM EDT by video and verified that I am speaking with the correct person using two identifiers.   I discussed the limitations, risks, security and privacy concerns of performing an evaluation and management service by video and the availability of in person appointments. I also discussed with the patient that there may be a patient responsible charge related to this service. The patient expressed understanding and agreed to proceed.  Patient location: Home Provider Location: [place] Participants: AARIYA FERRICK and Martin Majestic   Subjective:     Mekiah Wahler is a 40 y.o. female presenting for Fever (101.9 this morning), Cough (Dry/All symptoms started this morning. ), Sore Throat, Generalized Body Aches, and Chills     Fever  Associated symptoms include chest pain, congestion, coughing, headaches, nausea, a sore throat and vomiting.  Cough This is a new problem. Episode onset: 08/25/2020. The problem has been gradually worsening. The problem occurs constantly. The cough is non-productive. Associated symptoms include chest pain, chills, a fever, headaches, myalgias, rhinorrhea and a sore throat.  Sore Throat  Associated symptoms include congestion, coughing, headaches and vomiting.   Daughter just tested positive for Flu A - yesterday  Review of Systems  Constitutional: Positive for chills and fever.  HENT: Positive for congestion, rhinorrhea and sore throat.   Respiratory: Positive for cough.   Cardiovascular: Positive for chest pain.  Gastrointestinal: Positive for nausea and vomiting.  Musculoskeletal: Positive for myalgias.  Neurological: Positive for headaches.     Social History   Tobacco Use  Smoking Status Former Smoker  . Quit date: 04/10/2005  . Years since quitting: 15.3  Smokeless Tobacco Never Used        Objective:   BP Readings from Last 3 Encounters:  05/18/20 106/81  03/09/20 110/60   12/17/19 112/70   Wt Readings from Last 3 Encounters:  08/26/20 100 lb (45.4 kg)  07/02/20 96 lb (43.5 kg)  05/18/20 95 lb 11.2 oz (43.4 kg)   Ht 5\' 2"  (1.575 m)   Wt 100 lb (45.4 kg)   Breastfeeding No   BMI 18.29 kg/m    Physical Exam Constitutional:      Appearance: Normal appearance. She is not ill-appearing.  HENT:     Head: Normocephalic and atraumatic.     Right Ear: External ear normal.     Left Ear: External ear normal.  Eyes:     Conjunctiva/sclera: Conjunctivae normal.  Pulmonary:     Effort: Pulmonary effort is normal. No respiratory distress.     Comments: Coughing throughout visit Neurological:     Mental Status: She is alert. Mental status is at baseline.  Psychiatric:        Mood and Affect: Mood normal.        Behavior: Behavior normal.        Thought Content: Thought content normal.        Judgment: Judgment normal.            Assessment & Plan:   Problem List Items Addressed This Visit   None   Visit Diagnoses    Influenza A    -  Primary   Relevant Medications   oseltamivir (TAMIFLU) 75 MG capsule   chlorpheniramine-HYDROcodone (TUSSIONEX PENNKINETIC ER) 10-8 MG/5ML SUER     Given exposure to child no need to test. Within treatment window for tamiflu discussed risks.   Cough syrup for nighttime.   Return if symptoms worsen  or fail to improve.  Lesleigh Noe, MD

## 2020-08-26 NOTE — Patient Instructions (Addendum)
Based on your symptoms, it looks like you have a virus.   Antibiotics are not need for a viral infection but the following will help:   1. Drink plenty of fluids 2. Get lots of rest  Sinus Congestion 1) Neti Pot (Saline rinse) -- 2 times day -- if tolerated 2) Flonase (Store Brand ok) - once daily 3) Over the counter congestion medications  Cough 1) Cough drops can be helpful 2) Nyquil (or nighttime cough medication) 3) Honey is proven to be one of the best cough medications  4) Cough medicine with Dextromethorphan can also be helpful  Sore Throat 1) Honey as above, cough drops 2) Ibuprofen or Aleve can be helpful 3) Salt water Gargles  

## 2020-09-01 DIAGNOSIS — M2550 Pain in unspecified joint: Secondary | ICD-10-CM | POA: Diagnosis not present

## 2020-09-01 DIAGNOSIS — M069 Rheumatoid arthritis, unspecified: Secondary | ICD-10-CM | POA: Diagnosis not present

## 2020-09-01 DIAGNOSIS — G5701 Lesion of sciatic nerve, right lower limb: Secondary | ICD-10-CM | POA: Diagnosis not present

## 2020-09-01 DIAGNOSIS — G894 Chronic pain syndrome: Secondary | ICD-10-CM | POA: Diagnosis not present

## 2020-09-07 ENCOUNTER — Ambulatory Visit: Payer: BC Managed Care – PPO | Admitting: Internal Medicine

## 2020-09-21 DIAGNOSIS — G5700 Lesion of sciatic nerve, unspecified lower limb: Secondary | ICD-10-CM | POA: Diagnosis not present

## 2020-09-24 ENCOUNTER — Other Ambulatory Visit: Payer: Self-pay | Admitting: Physician Assistant

## 2020-09-24 ENCOUNTER — Ambulatory Visit
Admission: RE | Admit: 2020-09-24 | Discharge: 2020-09-24 | Disposition: A | Discharge status: Home / Self Care | Payer: BC Managed Care – PPO | Source: Ambulatory Visit | Attending: Physician Assistant | Admitting: Physician Assistant

## 2020-09-24 DIAGNOSIS — M542 Cervicalgia: Secondary | ICD-10-CM | POA: Diagnosis not present

## 2020-09-27 DIAGNOSIS — G894 Chronic pain syndrome: Secondary | ICD-10-CM | POA: Diagnosis not present

## 2020-09-27 DIAGNOSIS — M069 Rheumatoid arthritis, unspecified: Secondary | ICD-10-CM | POA: Diagnosis not present

## 2020-09-27 DIAGNOSIS — M542 Cervicalgia: Secondary | ICD-10-CM | POA: Diagnosis not present

## 2020-09-27 DIAGNOSIS — G5701 Lesion of sciatic nerve, right lower limb: Secondary | ICD-10-CM | POA: Diagnosis not present

## 2020-10-19 DIAGNOSIS — R200 Anesthesia of skin: Secondary | ICD-10-CM | POA: Diagnosis not present

## 2020-10-19 DIAGNOSIS — M5412 Radiculopathy, cervical region: Secondary | ICD-10-CM | POA: Diagnosis not present

## 2020-10-26 DIAGNOSIS — G5701 Lesion of sciatic nerve, right lower limb: Secondary | ICD-10-CM | POA: Diagnosis not present

## 2020-10-26 DIAGNOSIS — M2550 Pain in unspecified joint: Secondary | ICD-10-CM | POA: Diagnosis not present

## 2020-10-26 DIAGNOSIS — M069 Rheumatoid arthritis, unspecified: Secondary | ICD-10-CM | POA: Diagnosis not present

## 2020-10-26 DIAGNOSIS — G894 Chronic pain syndrome: Secondary | ICD-10-CM | POA: Diagnosis not present

## 2020-11-28 DIAGNOSIS — N3001 Acute cystitis with hematuria: Secondary | ICD-10-CM | POA: Diagnosis not present

## 2020-11-28 DIAGNOSIS — R300 Dysuria: Secondary | ICD-10-CM | POA: Diagnosis not present

## 2020-12-06 DIAGNOSIS — M2550 Pain in unspecified joint: Secondary | ICD-10-CM | POA: Diagnosis not present

## 2020-12-06 DIAGNOSIS — G894 Chronic pain syndrome: Secondary | ICD-10-CM | POA: Diagnosis not present

## 2020-12-06 DIAGNOSIS — G5701 Lesion of sciatic nerve, right lower limb: Secondary | ICD-10-CM | POA: Diagnosis not present

## 2020-12-06 DIAGNOSIS — M069 Rheumatoid arthritis, unspecified: Secondary | ICD-10-CM | POA: Diagnosis not present

## 2021-01-03 ENCOUNTER — Ambulatory Visit
Admission: RE | Admit: 2021-01-03 | Discharge: 2021-01-03 | Disposition: A | Discharge status: Home / Self Care | Payer: BC Managed Care – PPO | Source: Ambulatory Visit | Attending: Physician Assistant | Admitting: Physician Assistant

## 2021-01-03 ENCOUNTER — Other Ambulatory Visit: Payer: Self-pay | Admitting: Physician Assistant

## 2021-01-03 DIAGNOSIS — M2550 Pain in unspecified joint: Secondary | ICD-10-CM | POA: Diagnosis not present

## 2021-01-03 DIAGNOSIS — M79641 Pain in right hand: Secondary | ICD-10-CM | POA: Diagnosis not present

## 2021-01-03 DIAGNOSIS — M79642 Pain in left hand: Secondary | ICD-10-CM

## 2021-01-03 DIAGNOSIS — G894 Chronic pain syndrome: Secondary | ICD-10-CM | POA: Diagnosis not present

## 2021-01-03 DIAGNOSIS — M79651 Pain in right thigh: Secondary | ICD-10-CM | POA: Diagnosis not present

## 2021-01-03 DIAGNOSIS — G5701 Lesion of sciatic nerve, right lower limb: Secondary | ICD-10-CM | POA: Diagnosis not present

## 2021-01-27 DIAGNOSIS — Z79899 Other long term (current) drug therapy: Secondary | ICD-10-CM | POA: Diagnosis not present

## 2021-01-27 DIAGNOSIS — M069 Rheumatoid arthritis, unspecified: Secondary | ICD-10-CM | POA: Diagnosis not present

## 2021-01-27 DIAGNOSIS — G894 Chronic pain syndrome: Secondary | ICD-10-CM | POA: Diagnosis not present

## 2021-01-27 DIAGNOSIS — Z79891 Long term (current) use of opiate analgesic: Secondary | ICD-10-CM | POA: Diagnosis not present

## 2021-01-27 DIAGNOSIS — M542 Cervicalgia: Secondary | ICD-10-CM | POA: Diagnosis not present

## 2021-01-27 DIAGNOSIS — G5700 Lesion of sciatic nerve, unspecified lower limb: Secondary | ICD-10-CM | POA: Diagnosis not present

## 2021-02-24 ENCOUNTER — Encounter: Payer: Self-pay | Admitting: Internal Medicine

## 2021-02-25 DIAGNOSIS — M069 Rheumatoid arthritis, unspecified: Secondary | ICD-10-CM | POA: Diagnosis not present

## 2021-02-25 DIAGNOSIS — G894 Chronic pain syndrome: Secondary | ICD-10-CM | POA: Diagnosis not present

## 2021-02-25 DIAGNOSIS — M2550 Pain in unspecified joint: Secondary | ICD-10-CM | POA: Diagnosis not present

## 2021-02-25 DIAGNOSIS — M4722 Other spondylosis with radiculopathy, cervical region: Secondary | ICD-10-CM | POA: Diagnosis not present

## 2021-03-01 ENCOUNTER — Telehealth (INDEPENDENT_AMBULATORY_CARE_PROVIDER_SITE_OTHER): Payer: BC Managed Care – PPO | Admitting: Internal Medicine

## 2021-03-01 ENCOUNTER — Other Ambulatory Visit: Payer: Self-pay

## 2021-03-01 ENCOUNTER — Encounter: Payer: Self-pay | Admitting: Internal Medicine

## 2021-03-01 DIAGNOSIS — F330 Major depressive disorder, recurrent, mild: Secondary | ICD-10-CM | POA: Diagnosis not present

## 2021-03-01 MED ORDER — FLUOXETINE HCL 20 MG PO CAPS: 20.0000 mg | ORAL_CAPSULE | Freq: Every day | ORAL | 3 refills | Status: DC

## 2021-03-01 NOTE — Patient Instructions (Signed)
Please start the fluoxetine (prozac) at 20mg  every other day for a week, then increase to 20mg  daily. Let me know if you have any problems

## 2021-03-01 NOTE — Assessment & Plan Note (Addendum)
Had depression in high school Has recurred since then but trying to do without medication Also with anxiety  Did well with fluoxetine in the past--so will try that again Other considerations would be bupropion--she knows people who did well, or duloxetine  Will plan follow up in about a month Discussed side effects and black box warning about increased depression  Had gone to a counselor in the past---will plan to restart this

## 2021-03-01 NOTE — Progress Notes (Signed)
Subjective:    Patient ID: Debra Gibbs, female    DOB: February 16, 1981, 40 y.o.   MRN: [id number]  HPI Video virtual visit due to worsening depression Identification done Reviewed limitations and billing and she gave consent Participants---patient in her home and I am in my office  "I have put this off for a year " She thinks it is time for daily medication--having more bad days then good days Anxiety is high--but even when not as bad, she is depressed Just wants to sleep, not go anywhere  "I'm tired of feeling like this" Goes back to the separation Especially bad around the holidays Not sleeping well--due to anxiety  2 children--they have 2/2/3 schedule She drives them to their 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 1 each 5   HYDROcodone-acetaminophen (NORCO) 7.5-325 MG tablet Take 1 tablet by mouth every 4 (four) hours as needed.     levonorgestrel (MIRENA) 20 MCG/24HR IUD 1 each by Intrauterine route once.     LORazepam (ATIVAN) 0.5 MG tablet Take 1 tablet (0.5 mg total) by mouth 2 (two) times daily as needed for anxiety or sleep. 60 tablet 0   montelukast (SINGULAIR) 10 MG tablet Take 1 tablet (10 mg total) by mouth at bedtime. 90 tablet 3   Multiple Vitamin (MULTIVITAMIN) tablet Take 1 tablet by mouth daily.     pregabalin (LYRICA) 25 MG capsule Take 25 mg by mouth at bedtime.     No current facility-administered medications on file prior to visit.    Allergies  Allergen Reactions   Citalopram Hydrobromide Other (See Comments)    REACTION: shakes and  made her jaw tight   Macrobid [Nitrofurantoin] Nausea And Vomiting   Albuterol Anxiety    Past Medical History:  Diagnosis Date   Anxiety    Headache    with pregnancy only   Rheumatoid arthritis(714.0)    Vision abnormalities     Past Surgical History:  Procedure Laterality Date   WISDOM TOOTH EXTRACTION      Family History  Problem Relation Age of Onset   Lupus Mother    Arthritis Father     Social History   Socioeconomic History   Marital status: Legally Separated    Spouse name: Not on file   Number of children: 2   Years of education: Not on file   Highest education level: Not on file  Occupational History   Occupation: waitressing part-time, [employer] in [place]  Tobacco Use   Smoking status: Former    Types: Cigarettes    Quit date: 04/10/2005    Years since quitting: 15.9   Smokeless tobacco: Never  Substance and Sexual Activity   Alcohol use: No    Alcohol/week: 0.0 standard drinks   Drug use: No   Sexual activity: Not Currently    Partners: Male    Birth control/protection: None, I.U.D.  Other Topics Concern   Not on file  Social History Narrative   Not on file   Social Determinants of Health   Financial Resource Strain: Not on file  Food Insecurity: Not on file  Transportation Needs: Not on file  Physical Activity: Not on file  Stress: Not on file  Social Connections: Not on file  Intimate Partner Violence: Not on file   Review of Systems Appetite is poor Hasn't weighed--but probably has lost some weight    Objective:   Physical Exam Constitutional:      Appearance: Normal appearance.  Neurological:     Mental Status: She is alert.  Psychiatric:        Mood and Affect: Mood normal.        Behavior: Behavior normal.           Assessment & Plan:

## 2021-03-29 DIAGNOSIS — G5701 Lesion of sciatic nerve, right lower limb: Secondary | ICD-10-CM | POA: Diagnosis not present

## 2021-03-29 DIAGNOSIS — G894 Chronic pain syndrome: Secondary | ICD-10-CM | POA: Diagnosis not present

## 2021-03-29 DIAGNOSIS — M2550 Pain in unspecified joint: Secondary | ICD-10-CM | POA: Diagnosis not present

## 2021-03-29 DIAGNOSIS — M069 Rheumatoid arthritis, unspecified: Secondary | ICD-10-CM | POA: Diagnosis not present

## 2021-03-30 ENCOUNTER — Telehealth (INDEPENDENT_AMBULATORY_CARE_PROVIDER_SITE_OTHER): Payer: BC Managed Care – PPO | Admitting: Internal Medicine

## 2021-03-30 ENCOUNTER — Encounter: Payer: Self-pay | Admitting: Internal Medicine

## 2021-03-30 ENCOUNTER — Other Ambulatory Visit: Payer: Self-pay

## 2021-03-30 DIAGNOSIS — F330 Major depressive disorder, recurrent, mild: Secondary | ICD-10-CM | POA: Diagnosis not present

## 2021-03-30 MED ORDER — LORAZEPAM 0.5 MG PO TABS: 0.5000 mg | ORAL_TABLET | Freq: Two times a day (BID) | ORAL | 0 refills | Status: DC | PRN

## 2021-03-30 MED ORDER — DULOXETINE HCL 30 MG PO CPEP: 30.0000 mg | ORAL_CAPSULE | Freq: Every day | ORAL | 3 refills | Status: DC

## 2021-03-30 NOTE — Progress Notes (Signed)
Subjective:    Patient ID: Debra Gibbs, female    DOB: Dec 18, 1980, 40 y.o.   MRN: [id number]  HPI Video virtual visit for follow up of depression Identification done Reviewed limitations and billing and she gave consent Participants---patient at her home and I am in my office  Has had a stressful week Didn't tolerate the prozac---tried for a couple of weeks Nauseated all day and felt extremely tired This did resolve the symptoms  Ongoing depression Still feels she needs medication  Chronic pain---sciatic/piriformis and left shoulder/neck Hydrocodone and lyrica for a few years Depression not clearly related to these meds (and not clearly the pain either)  Current Outpatient Medications on File Prior to Visit  Medication Sig Dispense Refill   Budesonide (PULMICORT FLEXHALER) 90 MCG/ACT inhaler Inhale 1 puff into the lungs 2 (two) times daily. 1 each 5   HYDROcodone-acetaminophen (NORCO) 7.5-325 MG tablet Take 1 tablet by mouth every 4 (four) hours as needed.     levonorgestrel (MIRENA) 20 MCG/24HR IUD 1 each by Intrauterine route once.     LORazepam (ATIVAN) 0.5 MG tablet Take 1 tablet (0.5 mg total) by mouth 2 (two) times daily as needed for anxiety or sleep. 60 tablet 0   Multiple Vitamin (MULTIVITAMIN) tablet Take 1 tablet by mouth daily.     pregabalin (LYRICA) 25 MG capsule Take 25 mg by mouth at bedtime.     No current facility-administered medications on file prior to visit.    Allergies  Allergen Reactions   Citalopram Hydrobromide Other (See Comments)    REACTION: shakes and made her jaw tight   Macrobid [Nitrofurantoin] Nausea And Vomiting   Albuterol Anxiety   Fluoxetine Hcl Nausea Only    Past Medical History:  Diagnosis Date   Anxiety    Headache    with pregnancy only   Rheumatoid arthritis(714.0)    Vision abnormalities     Past Surgical History:  Procedure Laterality Date   WISDOM TOOTH EXTRACTION      Family History  Problem Relation Age  of Onset   Lupus Mother    Arthritis Father     Social History   Socioeconomic History   Marital status: Legally Separated    Spouse name: Not on file   Number of children: 2   Years of education: Not on file   Highest education level: Not on file  Occupational History   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  Other Topics Concern   Not on file  Social History Narrative   Not on file   Social Determinants of Health   Financial Resource Strain: Not on file  Food Insecurity: Not on file  Transportation Needs: Not on file  Physical Activity: Not on file  Stress: Not on file  Social Connections: Not on file  Intimate Partner Violence: Not on file   Review of Systems Not eating that well--has lost a few pounds recently Not sleeping well---mind will race She rarely uses the lorazepam--finally ran out of prescription over a year old     Objective:   Physical Exam Constitutional:  Appearance: Normal appearance.  Neurological:     Mental Status: She is alert.  Psychiatric:        Behavior: Behavior normal.     Comments: Normal interaction but seems on the verge of tears at times           Assessment & Plan:

## 2021-03-30 NOTE — Assessment & Plan Note (Signed)
Didn't tolerate the fluoxetine Discussed alternatives---I recommend duloxetine given that it may help with her chronic pain issues Will start at 30mg  Told her to contact me in 3-4 weeks if tolerating but depression not much better---and I will increase the dose to 60mg  Follow up 2 months

## 2021-03-30 NOTE — Patient Instructions (Signed)
Please contact me in 3-4 weeks if you are tolerating the duloxetine but it has not improved your depression considerably.

## 2021-04-08 ENCOUNTER — Other Ambulatory Visit: Payer: Self-pay | Admitting: Physician Assistant

## 2021-04-08 DIAGNOSIS — R531 Weakness: Secondary | ICD-10-CM

## 2021-04-18 ENCOUNTER — Encounter: Payer: Self-pay | Admitting: Internal Medicine

## 2021-05-03 DIAGNOSIS — G894 Chronic pain syndrome: Secondary | ICD-10-CM | POA: Diagnosis not present

## 2021-05-03 DIAGNOSIS — M545 Low back pain, unspecified: Secondary | ICD-10-CM | POA: Diagnosis not present

## 2021-05-03 DIAGNOSIS — M79651 Pain in right thigh: Secondary | ICD-10-CM | POA: Diagnosis not present

## 2021-05-03 DIAGNOSIS — G5701 Lesion of sciatic nerve, right lower limb: Secondary | ICD-10-CM | POA: Diagnosis not present

## 2021-05-10 ENCOUNTER — Other Ambulatory Visit: Payer: BC Managed Care – PPO

## 2021-05-23 ENCOUNTER — Inpatient Hospital Stay: Admission: RE | Admit: 2021-05-23 | Payer: BC Managed Care – PPO | Source: Ambulatory Visit

## 2021-05-28 DIAGNOSIS — Z03818 Encounter for observation for suspected exposure to other biological agents ruled out: Secondary | ICD-10-CM | POA: Diagnosis not present

## 2021-05-28 DIAGNOSIS — J069 Acute upper respiratory infection, unspecified: Secondary | ICD-10-CM | POA: Diagnosis not present

## 2021-05-28 DIAGNOSIS — J029 Acute pharyngitis, unspecified: Secondary | ICD-10-CM | POA: Diagnosis not present

## 2021-05-28 DIAGNOSIS — Z20822 Contact with and (suspected) exposure to covid-19: Secondary | ICD-10-CM | POA: Diagnosis not present

## 2021-05-30 DIAGNOSIS — Z13220 Encounter for screening for lipoid disorders: Secondary | ICD-10-CM | POA: Diagnosis not present

## 2021-05-30 DIAGNOSIS — Z681 Body mass index (BMI) 19 or less, adult: Secondary | ICD-10-CM | POA: Diagnosis not present

## 2021-05-30 DIAGNOSIS — Z136 Encounter for screening for cardiovascular disorders: Secondary | ICD-10-CM | POA: Diagnosis not present

## 2021-05-30 DIAGNOSIS — Z713 Dietary counseling and surveillance: Secondary | ICD-10-CM | POA: Diagnosis not present

## 2021-06-09 DIAGNOSIS — Z79891 Long term (current) use of opiate analgesic: Secondary | ICD-10-CM | POA: Diagnosis not present

## 2021-07-07 DIAGNOSIS — M533 Sacrococcygeal disorders, not elsewhere classified: Secondary | ICD-10-CM | POA: Diagnosis not present

## 2021-07-08 ENCOUNTER — Telehealth: Payer: Self-pay

## 2021-07-08 NOTE — Telephone Encounter (Signed)
Okay ?I have low suspicion for serious pathology ?

## 2021-07-08 NOTE — Telephone Encounter (Signed)
Pt called in to report that she had an syncope episode this morning while she was at [employer]. EMS was called and tested her sugar, which was normal, but her BP was low at 80/60. 20 mins later it was 97/66. When patient passed out she did hit her tailbone and back of head. Pt states she has a knot on back of head and a sore tailbone. ?EMS gave pt ER and concussion precautions.  ?Pt scheduled an appt with PCP on Monday, April 3 at 3:30. Pt states she will get a BP cuff to monitor her BP this weekend and will stay hydrated.  ?

## 2021-07-11 ENCOUNTER — Encounter: Payer: Self-pay | Admitting: Internal Medicine

## 2021-07-11 ENCOUNTER — Ambulatory Visit: Payer: BC Managed Care – PPO | Admitting: Internal Medicine

## 2021-07-11 VITALS — BP 110/74 | HR 83 | Temp 98.2°F | Ht 62.0 in | Wt 98.0 lb

## 2021-07-11 DIAGNOSIS — R55 Syncope and collapse: Secondary | ICD-10-CM | POA: Diagnosis not present

## 2021-07-11 NOTE — Progress Notes (Signed)
? ?Subjective:  ? ? Patient ID: Debra Gibbs, female    DOB: Nov 21, 1980, 41 y.o.   MRN: [id number] ? ?HPI ?Here for follow up after fainting spell 3 days ago ? ?Was at [facility] 3 days ago--in the morning ?Had gone through picking things out ?Then up to register---felt funny----like seeing stars, abnormal hearing. Felt she was going to pass out. ?Bent down (pretending to look at something)--- apparently stood back up and then passed out (backwards) ?Did hit head and coccyx ?Not sure how long she was out----but not long. Person in line talking to her ?Then felt hot and nauseated--they gave her an ice pack ?EMS came --took her into rig. BP 80/60 at first ?She declined ER evaluation ?Sugar 107. BP came up to 120/90 eventually ? ?Had eaten a little that day---cheese/crackers/ginger ale ?No illness or fever ? ?No incontinence ?Didn't bite tongue ? ?Pain in head due to knot from fall ?Did vomit twice the first night after fall ?Did drive again for the first time yesterday--vision glary ?Has notice some floaters ?Did work all day today--on computer (no problems) ?Coccyx hurting ? 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 ?Current Outpatient Medications on File Prior to Visit  ?Medication Sig Dispense Refill  ? Budesonide (PULMICORT FLEXHALER) 90 MCG/ACT inhaler Inhale 1 puff into the lungs 2 (two) times daily. 1 each 5  ? DULoxetine (CYMBALTA) 30 MG capsule Take 1 capsule (30 mg total) by mouth daily. 30 capsule 3  ? HYDROcodone-acetaminophen (NORCO) 7.5-325 MG tablet Take 1 tablet by mouth every 4 (four) hours as needed.    ? levonorgestrel (MIRENA) 20 MCG/24HR IUD 1 each by Intrauterine route once.    ? LORazepam (ATIVAN) 0.5 MG tablet Take 0.5 mg by mouth 2 (two) times daily as needed for anxiety.    ? Multiple Vitamin (MULTIVITAMIN) tablet Take 1 tablet by mouth daily.    ? ?No current facility-administered medications on file prior to visit.   ? ? ?Allergies  ?Allergen Reactions  ? Citalopram Hydrobromide Other (See Comments)  ?  REACTION: shakes and made her jaw tight  ? Macrobid [Nitrofurantoin] Nausea And Vomiting  ? Albuterol Anxiety  ? Fluoxetine Hcl Nausea Only  ? ? ?Past Medical History:  ?Diagnosis Date  ? Anxiety   ? Headache   ? with pregnancy only  ? Rheumatoid arthritis(714.0)   ? Vision abnormalities   ? ? ?Past Surgical History:  ?Procedure Laterality Date  ? WISDOM TOOTH EXTRACTION    ? ? ?Family History  ?Problem Relation Age of Onset  ? Lupus Mother   ? Arthritis Father   ? ? ?Social History  ? ?Socioeconomic History  ? Marital status: Legally Separated  ?  Spouse name: Not on file  ? Number of children: 2  ? Years of education: Not on file  ? Highest education level: Not on file  ?Occupational History  ? 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 Smoking status: Former  ?  Types: Cigarettes  ?  Quit date: 04/10/2005  ?  Years since quitting: 16.2  ?  Passive exposure: Never  ? Smokeless tobacco: Never  ?Substance and Sexual Activity  ? Alcohol use: No  ?  Alcohol/week: 0.0 standard drinks  ? Drug use: No  ? Sexual activity: Not Currently  ?  Partners: Male  ?  Birth control/protection: None, I.U.D.  ?Other Topics Concern  ? Not on file  ?Social History Narrative  ?  Not on file  ? ?Social Determinants of Health  ? ?Financial Resource Strain: Not on file  ?Food Insecurity: Not on file  ?Transportation Needs: Not on file  ?Physical Activity: Not on file  ?Stress: Not on file  ?Social Connections: Not on file  ?Intimate Partner Violence: Not on file  ? ?Review of Systems ?Had weird sharp pain in chest a couple of weeks ago ?Recurred once (likely heartburn) ?   ?Objective:  ? Physical Exam ?Constitutional:   ?   Appearance: Normal appearance.  ?HENT:  ?   Head:  ?   Comments: Tenderness over occiput with small swollen area ?   Mouth/Throat:  ?   Comments: No trismus ?Cardiovascular:  ?   Rate and Rhythm: Normal rate and regular  rhythm.  ?   Heart sounds: No murmur heard. ?  No gallop.  ?Pulmonary:  ?   Effort: Pulmonary effort is normal.  ?   Breath sounds: Normal breath sounds. No wheezing or rales.  ?Musculoskeletal:  ?   Cervical back: Neck supple.  ?   Right lower leg: No edema.  ?   Left lower leg: No edema.  ?Lymphadenopathy:  ?   Cervical: No cervical adenopathy.  ?Neurological:  ?   General: No focal deficit present.  ?   Mental Status: She is alert and oriented to person, place, and time.  ?  ? ? ? ? ?   ?Assessment & Plan:  ? ?

## 2021-07-11 NOTE — Assessment & Plan Note (Signed)
Clear vagal event ?Nothing to suggest seizure ?Glucose normal---had eaten some ?Mild head injury but no clear concussion ?Discussed strategy when premonitory symptoms come on ?Will check labs ---mostly to rule out anemia ? ?

## 2021-07-12 LAB — COMPREHENSIVE METABOLIC PANEL
ALT: 35 U/L (ref 0–35)
AST: 58 U/L — ABNORMAL HIGH (ref 0–37)
Albumin: 4.3 g/dL (ref 3.5–5.2)
Alkaline Phosphatase: 82 U/L (ref 39–117)
BUN: 10 mg/dL (ref 6–23)
CO2: 30 mEq/L (ref 19–32)
Calcium: 9.6 mg/dL (ref 8.4–10.5)
Chloride: 98 mEq/L (ref 96–112)
Creatinine, Ser: 0.61 mg/dL (ref 0.40–1.20)
GFR: 112.73 mL/min (ref >60.00)
Glucose, Bld: 75 mg/dL (ref 70–99)
Potassium: 4.1 mEq/L (ref 3.5–5.1)
Sodium: 136 mEq/L (ref 135–145)
Total Bilirubin: 0.3 mg/dL (ref 0.2–1.2)
Total Protein: 6.8 g/dL (ref 6.0–8.3)

## 2021-07-12 LAB — CBC
HCT: 37.6 % (ref 36.0–46.0)
Hemoglobin: 12.8 g/dL (ref 12.0–15.0)
MCHC: 33.9 g/dL (ref 30.0–36.0)
MCV: 104.7 fl — ABNORMAL HIGH (ref 78.0–100.0)
Platelets: 250 10*3/uL (ref 150.0–400.0)
RBC: 3.59 Mil/uL — ABNORMAL LOW (ref 3.87–5.11)
RDW: 12.2 % (ref 11.5–15.5)
WBC: 7.8 10*3/uL (ref 4.0–10.5)

## 2021-07-12 LAB — T4, FREE: Free T4: 0.8 ng/dL (ref 0.60–1.60)

## 2021-07-22 ENCOUNTER — Other Ambulatory Visit: Payer: BC Managed Care – PPO

## 2021-08-04 DIAGNOSIS — M533 Sacrococcygeal disorders, not elsewhere classified: Secondary | ICD-10-CM | POA: Diagnosis not present

## 2021-08-24 ENCOUNTER — Ambulatory Visit: Payer: BC Managed Care – PPO | Admitting: Nurse Practitioner

## 2021-08-24 ENCOUNTER — Encounter: Payer: Self-pay | Admitting: Nurse Practitioner

## 2021-08-24 VITALS — BP 104/76 | HR 108 | Temp 98.8°F | Resp 12 | Wt 95.0 lb

## 2021-08-24 DIAGNOSIS — J020 Streptococcal pharyngitis: Secondary | ICD-10-CM | POA: Diagnosis not present

## 2021-08-24 DIAGNOSIS — J029 Acute pharyngitis, unspecified: Secondary | ICD-10-CM

## 2021-08-24 DIAGNOSIS — R0982 Postnasal drip: Secondary | ICD-10-CM | POA: Insufficient documentation

## 2021-08-24 LAB — POCT RAPID STREP A (OFFICE): Rapid Strep A Screen: POSITIVE — AB

## 2021-08-24 MED ORDER — AMOXICILLIN 500 MG PO CAPS: 500.0000 mg | ORAL_CAPSULE | Freq: Two times a day (BID) | ORAL | 0 refills | Status: AC

## 2021-08-24 MED ORDER — FLUTICASONE PROPIONATE 50 MCG/ACT NA SUSP: 2.0000 | Freq: Every day | NASAL | 0 refills | Status: DC

## 2021-08-24 NOTE — Assessment & Plan Note (Signed)
Strep test positive in office.  We will elect to treat patient with amoxicillin 500 mg twice daily for 10 days.  Patient directed to finish the complete course of antibiotics.  Follow-up if no improvement ?

## 2021-08-24 NOTE — Patient Instructions (Signed)
Nice to see you today ?I sent in the antibiotics and nose spray to the pharmacy ?Follow up if no improvement ? ?Can continue to use OTC tylenol or ibuprofen for the fever and pain. Also use throat lozenges and warm salt water gargles to help ?

## 2021-08-24 NOTE — Progress Notes (Signed)
? ?Acute Office Visit ? ?Subjective:  ? ?  ?Patient ID: Debra Gibbs, female    DOB: 1981/01/13, 41 y.o.   MRN: [id number] ? ?Chief Complaint  ?Patient presents with  ? Sore Throat  ?  Started late on 08/22/21, daughter had strep on 08/19/21. Sx are sore throat, cough, sneezing, body aches, chills, fever 100.9 this morning  ? ? ? ?Patient is in today for sore throat ? ?Started on 08/22/2021 ?Daughter had strep on 08/19/2021 ?No other sick contacts ?Pfizer vaccines x 2 ?No flu vaccine ?No covid  test at home ?Took tylenol this morning around 8am, that seems to help ? ? ?Review of Systems  ?Constitutional:  Positive for chills, fever and malaise/fatigue.  ?HENT:  Positive for congestion and sore throat. Negative for ear discharge and ear pain.   ?     Pnd ?  ?Respiratory:  Positive for cough (dry). Negative for shortness of breath.   ?Cardiovascular:  Negative for chest pain.  ?Musculoskeletal:  Negative for joint pain and myalgias.  ?Neurological:  Positive for headaches.  ? ? ?   ?Objective:  ?  ?BP 104/76   Pulse (!) 108   Temp 98.8 ?F (37.1 ?C)   Resp 12   Wt 95 lb (43.1 kg)   SpO2 99%   BMI 17.38 kg/m?  ? ? ?Physical Exam ?Vitals and nursing note reviewed.  ?Constitutional:   ?   Appearance: She is well-developed.  ?HENT:  ?   Right Ear: Tympanic membrane and ear canal normal.  ?   Left Ear: Tympanic membrane and ear canal normal.  ?   Nose:  ?   Right Sinus: No maxillary sinus tenderness or frontal sinus tenderness.  ?   Left Sinus: No maxillary sinus tenderness or frontal sinus tenderness.  ?   Mouth/Throat:  ?   Mouth: Mucous membranes are moist.  ?   Pharynx: Posterior oropharyngeal erythema present.  ?Cardiovascular:  ?   Rate and Rhythm: Regular rhythm. Tachycardia present.  ?   Heart sounds: Normal heart sounds.  ?Pulmonary:  ?   Effort: Pulmonary effort is normal.  ?   Breath sounds: Normal breath sounds.  ?Lymphadenopathy:  ?   Cervical: Cervical adenopathy present.  ?Neurological:  ?   Mental  Status: She is alert.  ? ? ?Results for orders placed or performed in visit on 08/24/21  ?Rapid Strep A  ?Result Value Ref Range  ? Rapid Strep A Screen Positive (A) Negative  ? ? ? ?   ?Assessment & Plan:  ? ?Problem List Items Addressed This Visit   ? ?  ? Respiratory  ? Strep pharyngitis  ?  Strep test positive in office.  We will elect to treat patient with amoxicillin 500 mg twice daily for 10 days.  Patient directed to finish the complete course of antibiotics.  Follow-up if no improvement ? ?  ?  ? Relevant Medications  ? amoxicillin (AMOXIL) 500 MG capsule  ?  ? Other  ? Sore throat - Primary  ?  Strep test positive in office.  Did encourage patient to continue using over-the-counter analgesics as needed along with throat lozenges and warm salt water gargles. ? ?  ?  ? Relevant Orders  ? Rapid Strep A (Completed)  ? PND (post-nasal drip)  ?  Patient to start fluticasone 2 sprays each nostril once daily.  Did give precautions in regards to inciting epistaxis.  Patient continue taking over-the-counter antihistamine ? ?  ?  ?  Relevant Medications  ? fluticasone (FLONASE) 50 MCG/ACT nasal spray  ? ? ?Meds ordered this encounter  ?Medications  ? amoxicillin (AMOXIL) 500 MG capsule  ?  Sig: Take 1 capsule (500 mg total) by mouth 2 (two) times daily for 10 days.  ?  Dispense:  20 capsule  ?  Refill:  0  ?  Order Specific Question:   Supervising Provider  ?  Answer:   Loura Pardon A [[id number]]  ? fluticasone (FLONASE) 50 MCG/ACT nasal spray  ?  Sig: Place 2 sprays into both nostrils daily.  ?  Dispense:  16 g  ?  Refill:  0  ?  Order Specific Question:   Supervising Provider  ?  Answer:   Loura Pardon A [[id number]]  ? ? ?No follow-ups on file. ? ?Romilda Garret, NP ? ? ?

## 2021-08-24 NOTE — Assessment & Plan Note (Signed)
Patient to start fluticasone 2 sprays each nostril once daily.  Did give precautions in regards to inciting epistaxis.  Patient continue taking over-the-counter antihistamine ?

## 2021-08-24 NOTE — Assessment & Plan Note (Signed)
Strep test positive in office.  Did encourage patient to continue using over-the-counter analgesics as needed along with throat lozenges and warm salt water gargles. ?

## 2021-08-30 DIAGNOSIS — M533 Sacrococcygeal disorders, not elsewhere classified: Secondary | ICD-10-CM | POA: Diagnosis not present

## 2021-09-15 ENCOUNTER — Other Ambulatory Visit: Payer: Self-pay | Admitting: Nurse Practitioner

## 2021-09-15 DIAGNOSIS — R0982 Postnasal drip: Secondary | ICD-10-CM

## 2021-09-19 DIAGNOSIS — M532X2 Spinal instabilities, cervical region: Secondary | ICD-10-CM | POA: Diagnosis not present

## 2021-09-19 DIAGNOSIS — M542 Cervicalgia: Secondary | ICD-10-CM | POA: Diagnosis not present

## 2021-09-19 DIAGNOSIS — S161XXD Strain of muscle, fascia and tendon at neck level, subsequent encounter: Secondary | ICD-10-CM | POA: Diagnosis not present

## 2021-09-27 DIAGNOSIS — M542 Cervicalgia: Secondary | ICD-10-CM | POA: Diagnosis not present

## 2021-09-27 DIAGNOSIS — M532X2 Spinal instabilities, cervical region: Secondary | ICD-10-CM | POA: Diagnosis not present

## 2021-09-27 DIAGNOSIS — S161XXD Strain of muscle, fascia and tendon at neck level, subsequent encounter: Secondary | ICD-10-CM | POA: Diagnosis not present

## 2021-09-27 DIAGNOSIS — M5412 Radiculopathy, cervical region: Secondary | ICD-10-CM | POA: Diagnosis not present

## 2021-10-26 DIAGNOSIS — M5412 Radiculopathy, cervical region: Secondary | ICD-10-CM | POA: Diagnosis not present

## 2021-10-27 ENCOUNTER — Other Ambulatory Visit: Payer: Self-pay | Admitting: Pain Medicine

## 2021-10-27 DIAGNOSIS — M5412 Radiculopathy, cervical region: Secondary | ICD-10-CM

## 2021-11-15 ENCOUNTER — Telehealth (INDEPENDENT_AMBULATORY_CARE_PROVIDER_SITE_OTHER): Payer: BC Managed Care – PPO | Admitting: Nurse Practitioner

## 2021-11-15 DIAGNOSIS — R0602 Shortness of breath: Secondary | ICD-10-CM | POA: Diagnosis not present

## 2021-11-15 DIAGNOSIS — R051 Acute cough: Secondary | ICD-10-CM | POA: Diagnosis not present

## 2021-11-15 DIAGNOSIS — J069 Acute upper respiratory infection, unspecified: Secondary | ICD-10-CM | POA: Diagnosis not present

## 2021-11-15 MED ORDER — AZITHROMYCIN 250 MG PO TABS
ORAL_TABLET | ORAL | 0 refills | Status: AC
Start: 2021-11-15 — End: 2021-11-20

## 2021-11-15 MED ORDER — HYDROCODONE BIT-HOMATROP MBR 5-1.5 MG/5ML PO SOLN
5.0000 mL | Freq: Every evening | ORAL | 0 refills | Status: AC | PRN
Start: 2021-11-15 — End: 2021-11-25

## 2021-11-15 MED ORDER — PREDNISONE 20 MG PO TABS: ORAL_TABLET | ORAL | 0 refills | Status: AC

## 2021-11-15 NOTE — Assessment & Plan Note (Signed)
Patient is using Delsym cough medication without great relief also tried Celexa or Tessalon Perles without relief.  Patient can use Delsym during the day we will send in hydrocodone cough medication nightly as needed.  Sedation precautions reviewed

## 2021-11-15 NOTE — Assessment & Plan Note (Signed)
We will treat with azithromycin 250 mg package.  Patient's daughter has been sick and recently went to pediatrician with 2 weeks for the symptoms and diagnosed with the beginnings of pneumonia and treated with same antibiotic.  Patient is COVID testing that was negative.  Follow-up if no improvement

## 2021-11-15 NOTE — Assessment & Plan Note (Signed)
Shortness of breath on exertion some with rest.  Patient does not think she is wheezing cannot tolerate albuterol.  We will send in prednisone 20 mg tablets taper.

## 2021-11-15 NOTE — Progress Notes (Signed)
Patient ID: Debra Gibbs, female    DOB: 10/13/1980, 41 y.o.   MRN: [id number]  Virtual visit completed through [place], a video enabled telemedicine application. Due to national recommendations of social distancing due to COVID-19, a virtual visit is felt to be most appropriate for this patient at this time. Reviewed limitations, risks, security and privacy concerns of performing a virtual visit and the availability of in person appointments. I also reviewed that there may be a patient responsible charge related to this service. The patient agreed to proceed.   Patient location: home Provider location: [place] at [facility], office Persons participating in this virtual visit: patient, provider   If any vitals were documented, they were collected by patient at home unless specified below.    There were no vitals taken for this visit.   CC: Cough  Subjective:   HPI: Debra Gibbs is a 41 y.o. female presenting on 11/15/2021 for Cough (Sx on 11/10/21-started with sinus issues like sinus pressure and pain, then on 11/11/21 started having dry cough, SOB, chest tightness, headache feels like maybe from coughing, sore throat/irritated, chills on 11/12/21. Covid test negative on 11/12/21. Has taking Tylenol, Delsym, Tessalon perles with no relief.)    Symptoms started on 11/12/2021 States it came on like a sinus stuff. States on states that on staurda with cough and chest tightness Covid test Saturday that was negative  Covid vaccines Daughter has been ill for approx 2 weeks. And is being treated with zpak Been takin delsyum and tylenol. Delsyum heps some. Worse at night. She did try tessalon perales      Relevant past medical, surgical, family and social history reviewed and updated as indicated. Interim medical history since our last visit reviewed. Allergies and medications reviewed and updated. Outpatient Medications Prior to Visit  Medication Sig Dispense Refill   fluticasone (FLONASE)  50 MCG/ACT nasal spray SPRAY 2 SPRAYS INTO EACH NOSTRIL EVERY DAY 16 mL 2   IBU 800 MG tablet SMARTSIG:1 Tablet(s) By Mouth 1-3 Times Daily PRN     levonorgestrel (MIRENA) 20 MCG/24HR IUD 1 each by Intrauterine route once.     Multiple Vitamin (MULTIVITAMIN) tablet Take 1 tablet by mouth daily.     HYDROcodone-acetaminophen (NORCO) 7.5-325 MG tablet Take 1 tablet by mouth every 4 (four) hours as needed. (Patient not taking: Reported on 11/15/2021)     LORazepam (ATIVAN) 0.5 MG tablet Take 0.5 mg by mouth 2 (two) times daily as needed for anxiety. (Patient not taking: Reported on 11/15/2021)     No facility-administered medications prior to visit.     Per HPI unless specifically indicated in ROS section below Review of Systems  Constitutional:  Positive for fatigue and fever. Negative for appetite change and chills.  HENT:  Positive for congestion and postnasal drip.   Respiratory:  Positive for cough and shortness of breath.   Cardiovascular:  Positive for chest pain.  Gastrointestinal:  Positive for nausea. Negative for abdominal pain, constipation and diarrhea.  Neurological:  Positive for headaches.   Objective:  There were no vitals taken for this visit.  Wt Readings from Last 3 Encounters:  08/24/21 95 lb (43.1 kg)  07/11/21 98 lb (44.5 kg)  03/01/21 96 lb (43.5 kg)       Physical exam: Gen: alert, NAD, not ill appearing Pulm: speaks in complete sentences without increased work of breathing Psych: normal mood, normal thought content      Results for orders placed or performed in visit on  08/24/21  Rapid Strep A  Result Value Ref Range   Rapid Strep A Screen Positive (A) Negative   Assessment & Plan:   Problem List Items Addressed This Visit       Respiratory   Upper respiratory tract infection - Primary    We will treat with azithromycin 250 mg package.  Patient's daughter has been sick and recently went to pediatrician with 2 weeks for the symptoms and diagnosed with  the beginnings of pneumonia and treated with same antibiotic.  Patient is COVID testing that was negative.  Follow-up if no improvement      Relevant Medications   azithromycin (ZITHROMAX) 250 MG tablet     Other   Cough    Patient is using Delsym cough medication without great relief also tried Celexa or Tessalon Perles without relief.  Patient can use Delsym during the day we will send in hydrocodone cough medication nightly as needed.  Sedation precautions reviewed      Relevant Medications   HYDROcodone bit-homatropine (HYCODAN) 5-1.5 MG/5ML syrup   Shortness of breath    Shortness of breath on exertion some with rest.  Patient does not think she is wheezing cannot tolerate albuterol.  We will send in prednisone 20 mg tablets taper.      Relevant Medications   predniSONE (DELTASONE) 20 MG tablet     Meds ordered this encounter  Medications   azithromycin (ZITHROMAX) 250 MG tablet    Sig: Take 2 tablets on day 1, then 1 tablet daily on days 2 through 5    Dispense:  6 tablet    Refill:  0    Order Specific Question:   Supervising Provider    Answer:   Glori Bickers, MARNE A [[id number]]   HYDROcodone bit-homatropine (HYCODAN) 5-1.5 MG/5ML syrup    Sig: Take 5 mLs by mouth at bedtime as needed for up to 10 days for cough.    Dispense:  50 mL    Refill:  0    Order Specific Question:   Supervising Provider    Answer:   Loura Pardon A [[id number]]   predniSONE (DELTASONE) 20 MG tablet    Sig: Take 1 tablet (20 mg total) by mouth 2 (two) times daily with a meal for 3 days, THEN 1 tablet (20 mg total) daily with breakfast for 3 days. DO NOT take NSAIDs like: ibuprofen, motrin, aleve, naproxen, BC/Goody Powder while on this medication.    Dispense:  9 tablet    Refill:  0    Order Specific Question:   Supervising Provider    Answer:   TOWER, MARNE A [[id number]]   No orders of the defined types were placed in this encounter.   I discussed the assessment and treatment plan with the patient. The  patient was provided an opportunity to ask questions and all were answered. The patient agreed with the plan and demonstrated an understanding of the instructions. The patient was advised to call back or seek an in-person evaluation if the symptoms worsen or if the condition fails to improve as anticipated.  Follow up plan: Return if symptoms worsen or fail to improve.  Romilda Garret, NP

## 2021-11-19 ENCOUNTER — Ambulatory Visit
Admission: RE | Admit: 2021-11-19 | Discharge: 2021-11-19 | Disposition: A | Discharge status: Home / Self Care | Payer: BC Managed Care – PPO | Source: Ambulatory Visit | Attending: Pain Medicine | Admitting: Pain Medicine

## 2021-11-19 DIAGNOSIS — M4802 Spinal stenosis, cervical region: Secondary | ICD-10-CM | POA: Diagnosis not present

## 2021-11-19 DIAGNOSIS — M47812 Spondylosis without myelopathy or radiculopathy, cervical region: Secondary | ICD-10-CM | POA: Diagnosis not present

## 2021-11-19 DIAGNOSIS — M5412 Radiculopathy, cervical region: Secondary | ICD-10-CM

## 2021-11-24 DIAGNOSIS — M5412 Radiculopathy, cervical region: Secondary | ICD-10-CM | POA: Diagnosis not present

## 2021-12-13 DIAGNOSIS — M5412 Radiculopathy, cervical region: Secondary | ICD-10-CM | POA: Diagnosis not present

## 2021-12-20 DIAGNOSIS — M5412 Radiculopathy, cervical region: Secondary | ICD-10-CM | POA: Diagnosis not present

## 2021-12-28 ENCOUNTER — Encounter: Payer: Self-pay | Admitting: Internal Medicine

## 2022-01-17 DIAGNOSIS — M461 Sacroiliitis, not elsewhere classified: Secondary | ICD-10-CM | POA: Diagnosis not present

## 2022-01-17 DIAGNOSIS — Z79891 Long term (current) use of opiate analgesic: Secondary | ICD-10-CM | POA: Diagnosis not present

## 2022-02-08 DIAGNOSIS — M533 Sacrococcygeal disorders, not elsewhere classified: Secondary | ICD-10-CM | POA: Diagnosis not present

## 2022-02-15 DIAGNOSIS — M47812 Spondylosis without myelopathy or radiculopathy, cervical region: Secondary | ICD-10-CM | POA: Diagnosis not present

## 2022-02-22 DIAGNOSIS — U071 COVID-19: Secondary | ICD-10-CM | POA: Diagnosis not present

## 2022-02-22 DIAGNOSIS — M791 Myalgia, unspecified site: Secondary | ICD-10-CM | POA: Diagnosis not present

## 2022-02-22 DIAGNOSIS — Z681 Body mass index (BMI) 19 or less, adult: Secondary | ICD-10-CM | POA: Diagnosis not present

## 2022-02-26 DIAGNOSIS — R112 Nausea with vomiting, unspecified: Secondary | ICD-10-CM | POA: Diagnosis not present

## 2022-02-26 DIAGNOSIS — U071 COVID-19: Secondary | ICD-10-CM | POA: Diagnosis not present

## 2022-03-15 DIAGNOSIS — M545 Low back pain, unspecified: Secondary | ICD-10-CM | POA: Diagnosis not present

## 2023-04-07 IMAGING — CR DG HAND COMPLETE 3+V*L*
3 series · 3 of 3 positions shown · non-contrast
Comparison: None.

CLINICAL DATA: Bilateral hand pain along 1st MC, and right hand
pain along 2nd and 3rd MC on right hand x 3-4 months with no known
injury. Patient unable to hold hand still in lateral projections.

EXAM:
LEFT HAND - COMPLETE 3+ VIEW

[x hand pa left]
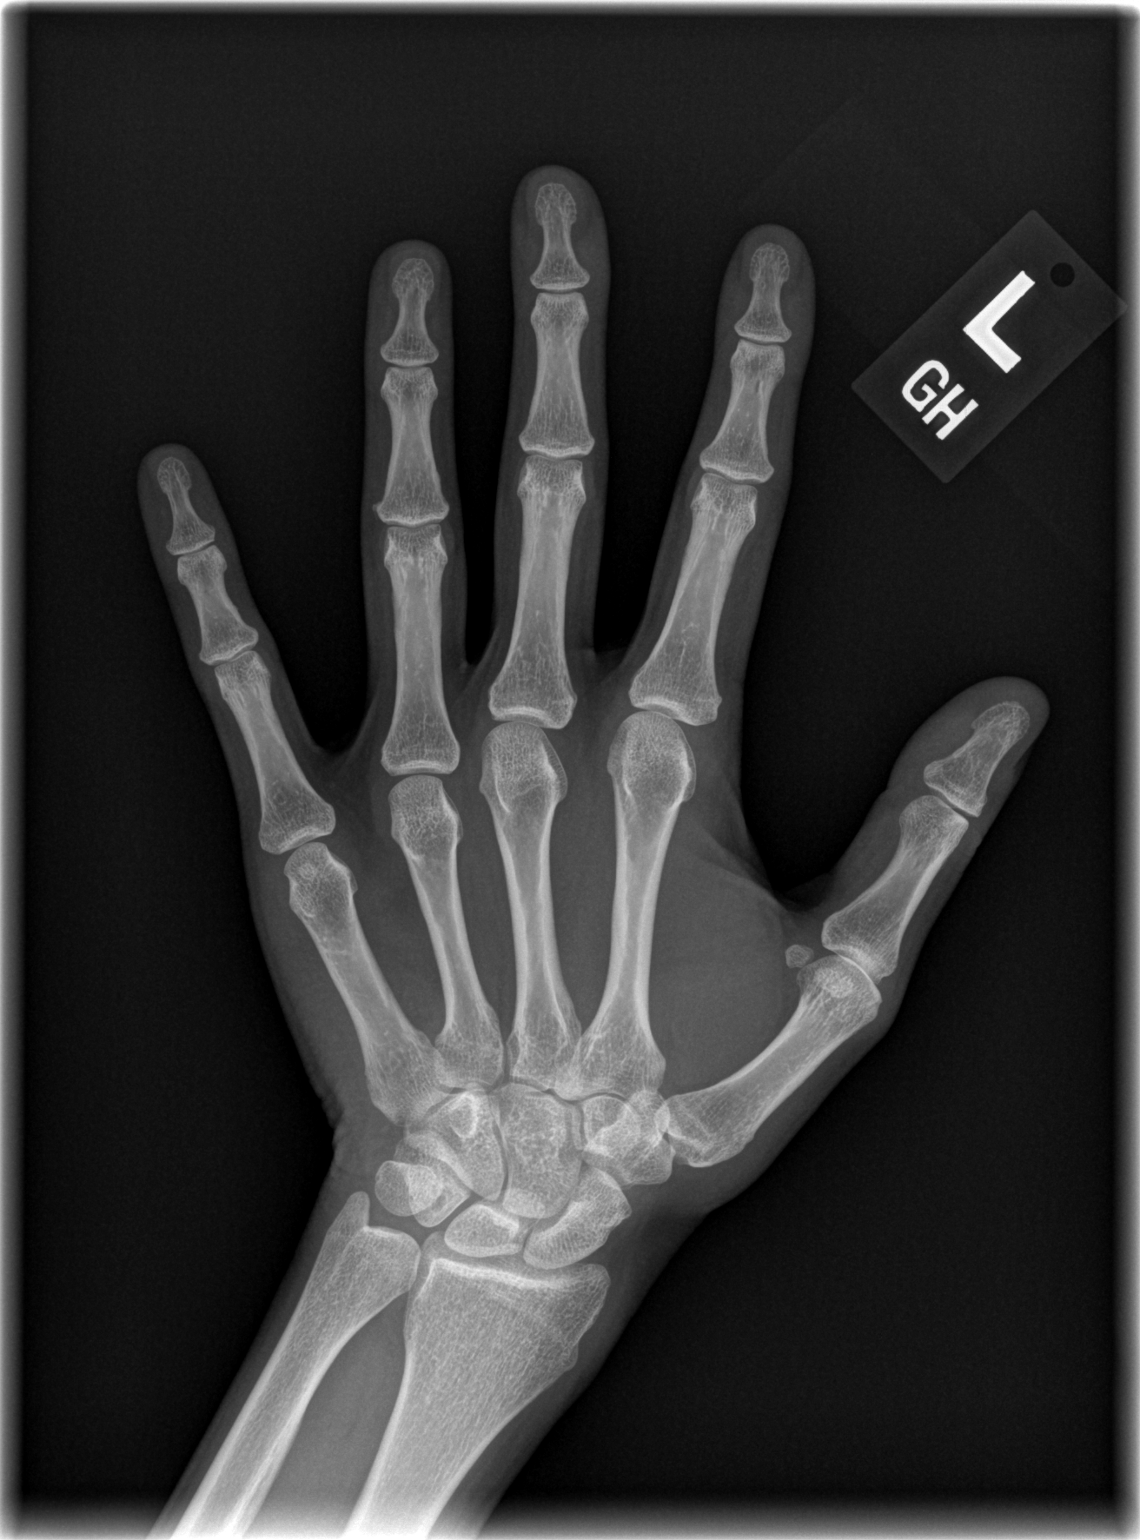

[x hand oblique left]
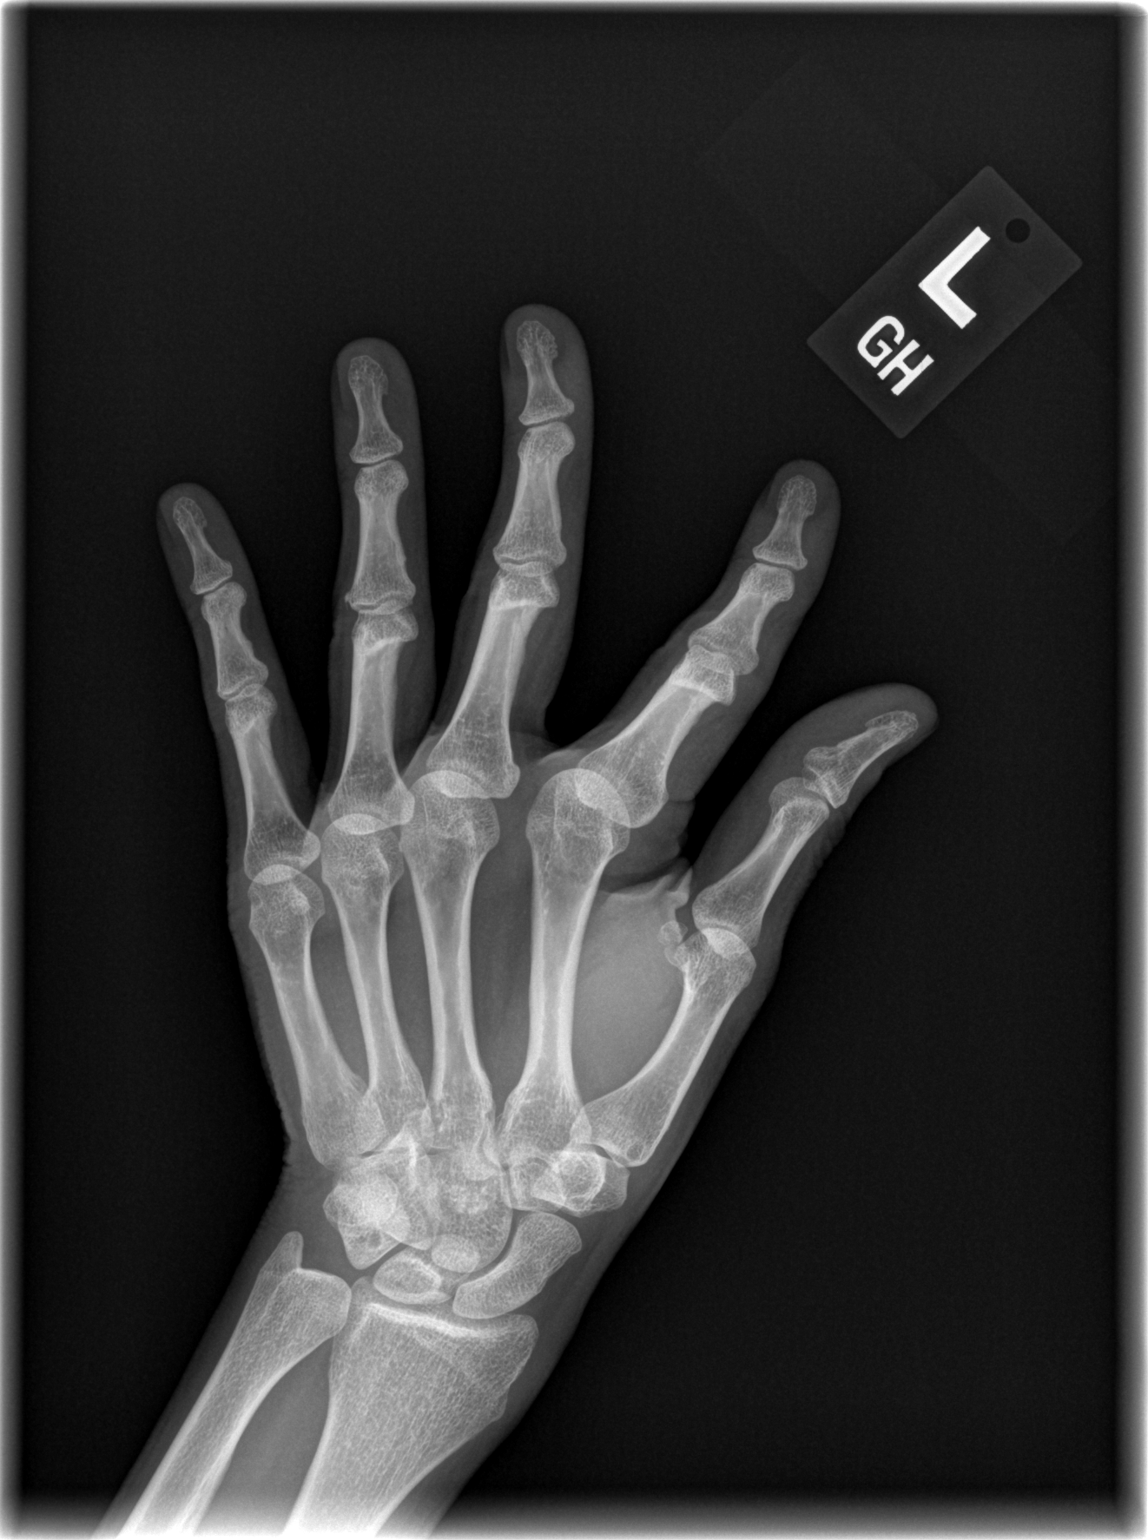

[x hand lat left]
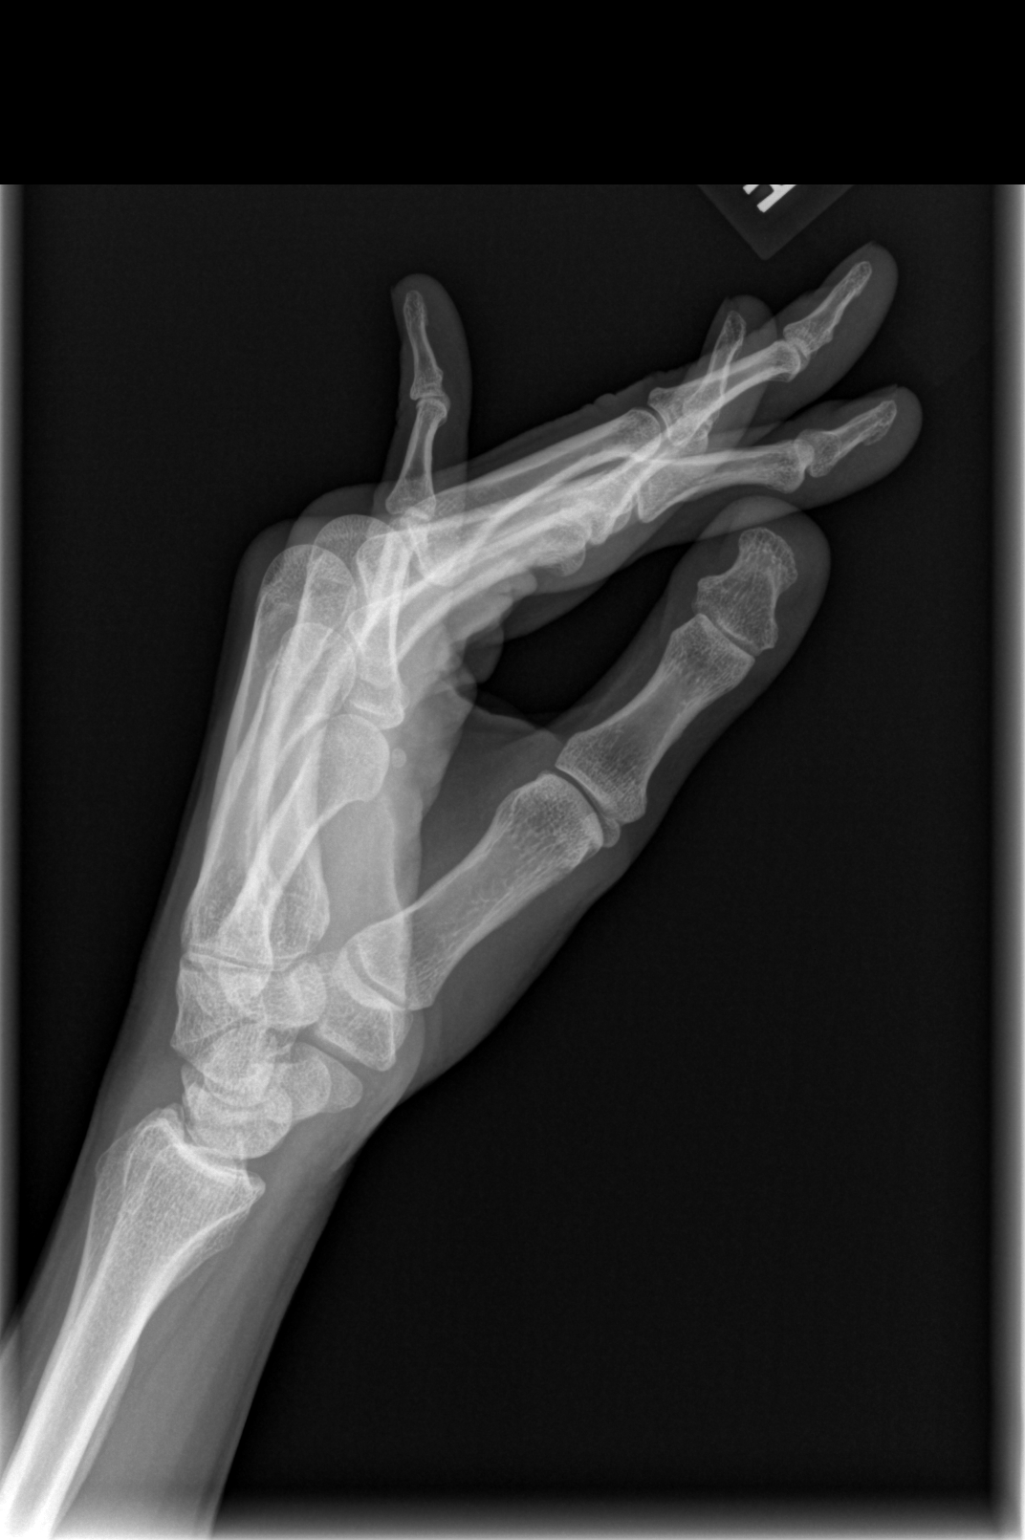

[3 of 3 positions shown; findings below may reference images not displayed]

FINDINGS: No evidence of fracture, dislocation, or joint effusion. No evidence
of severe arthropathy. No aggressive appearing focal bone
abnormality. Soft tissues are unremarkable.
IMPRESSION: Negative.

## 2023-04-07 IMAGING — CR DG HAND COMPLETE 3+V*R*
3 series · 3 of 3 positions shown · non-contrast
Comparison: None.

CLINICAL DATA: Bilateral hand pain along 1st MC, and right hand
pain along 2nd and 3rd MC on right hand x 3-4 months with no known
injury. Patient unable to hold hand still in lateral projections.

EXAM:
RIGHT HAND - COMPLETE 3+ VIEW

[x hand pa right]
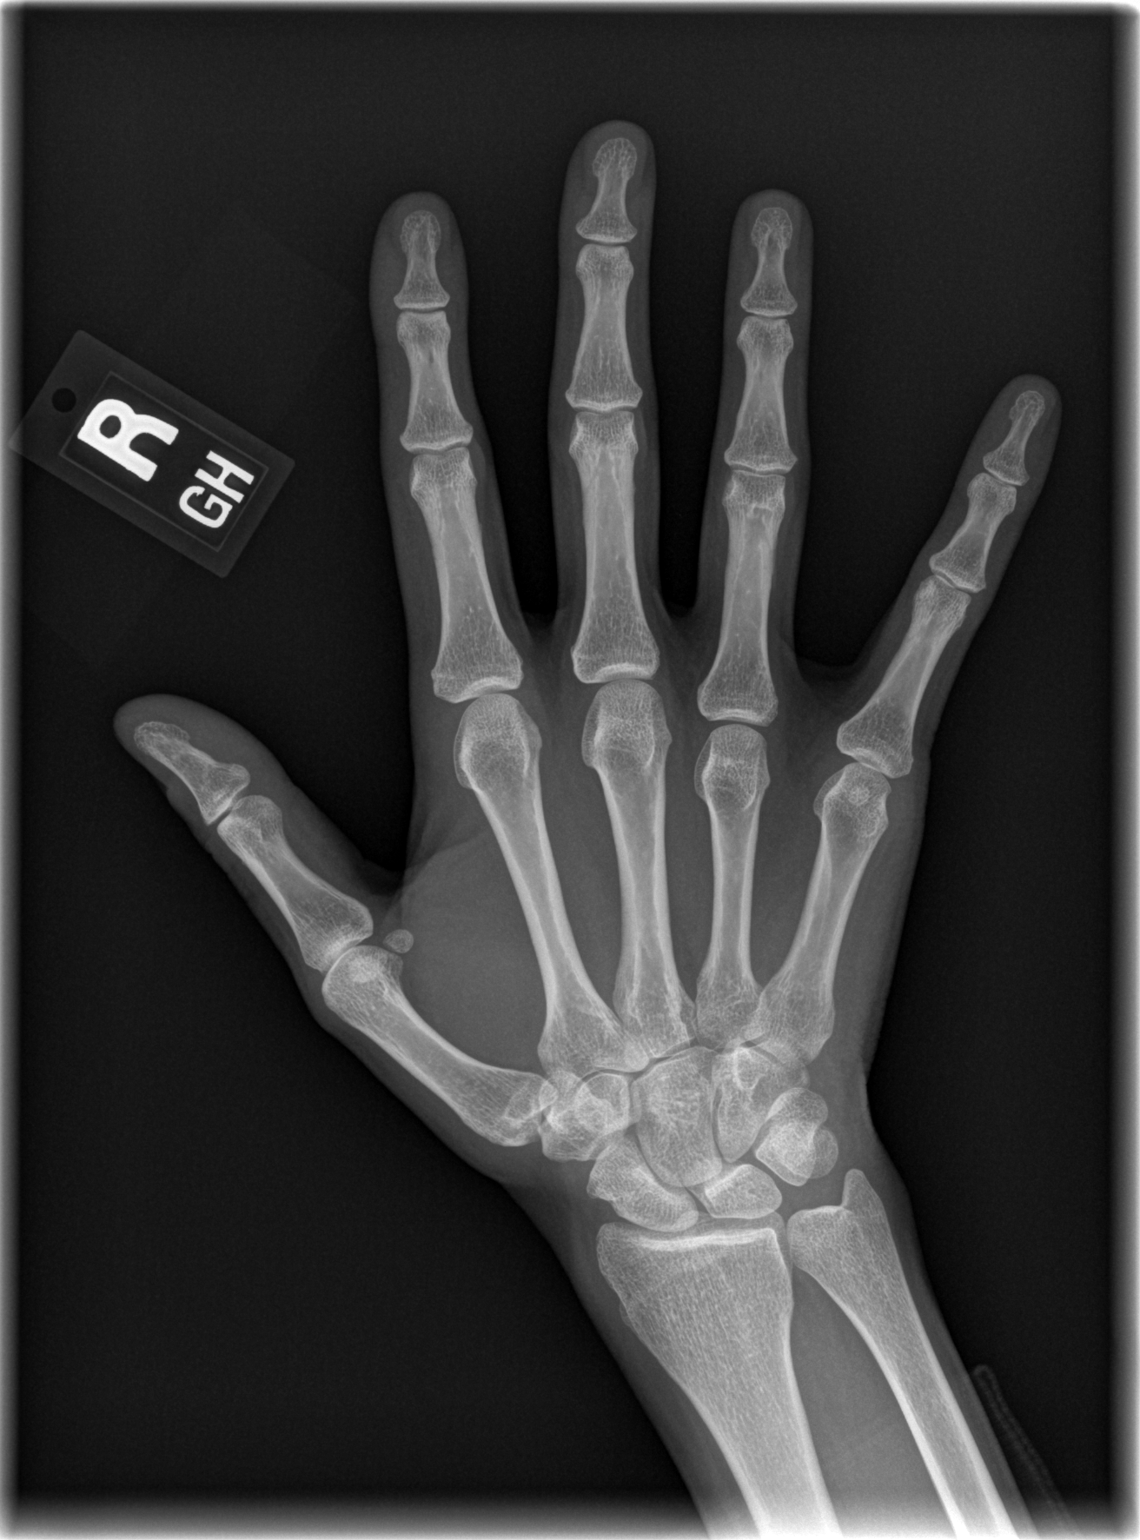

[x hand oblique right]
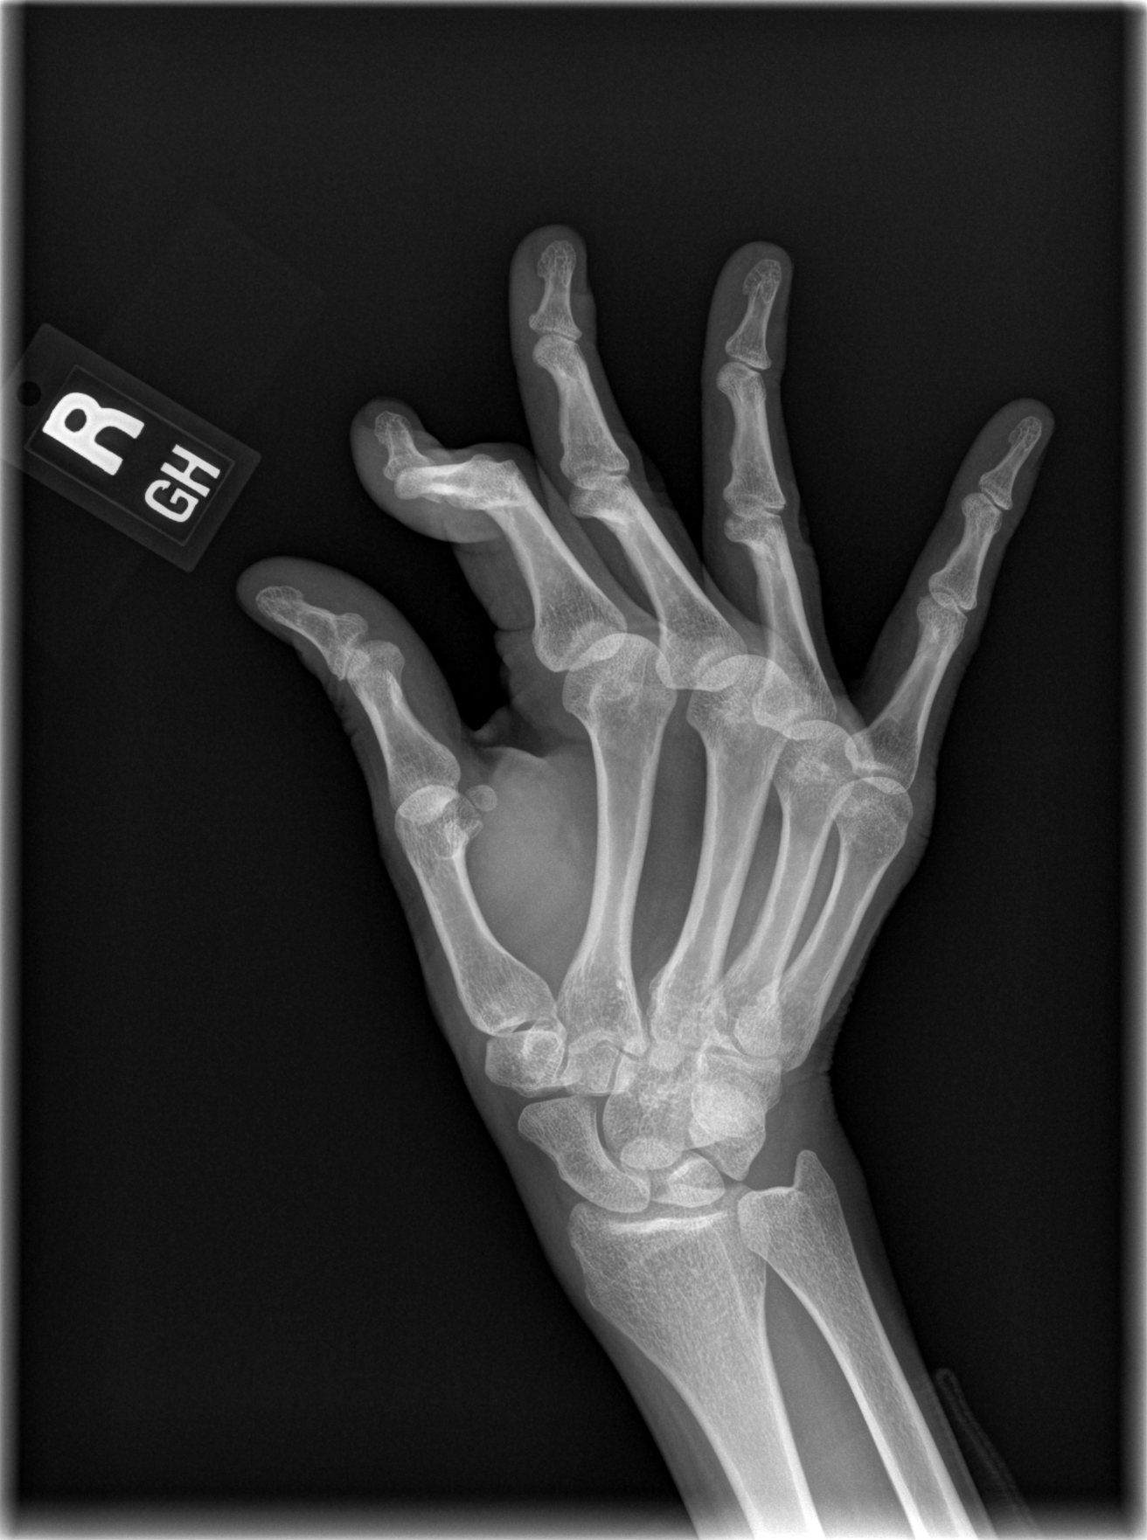

[x hand lat right]
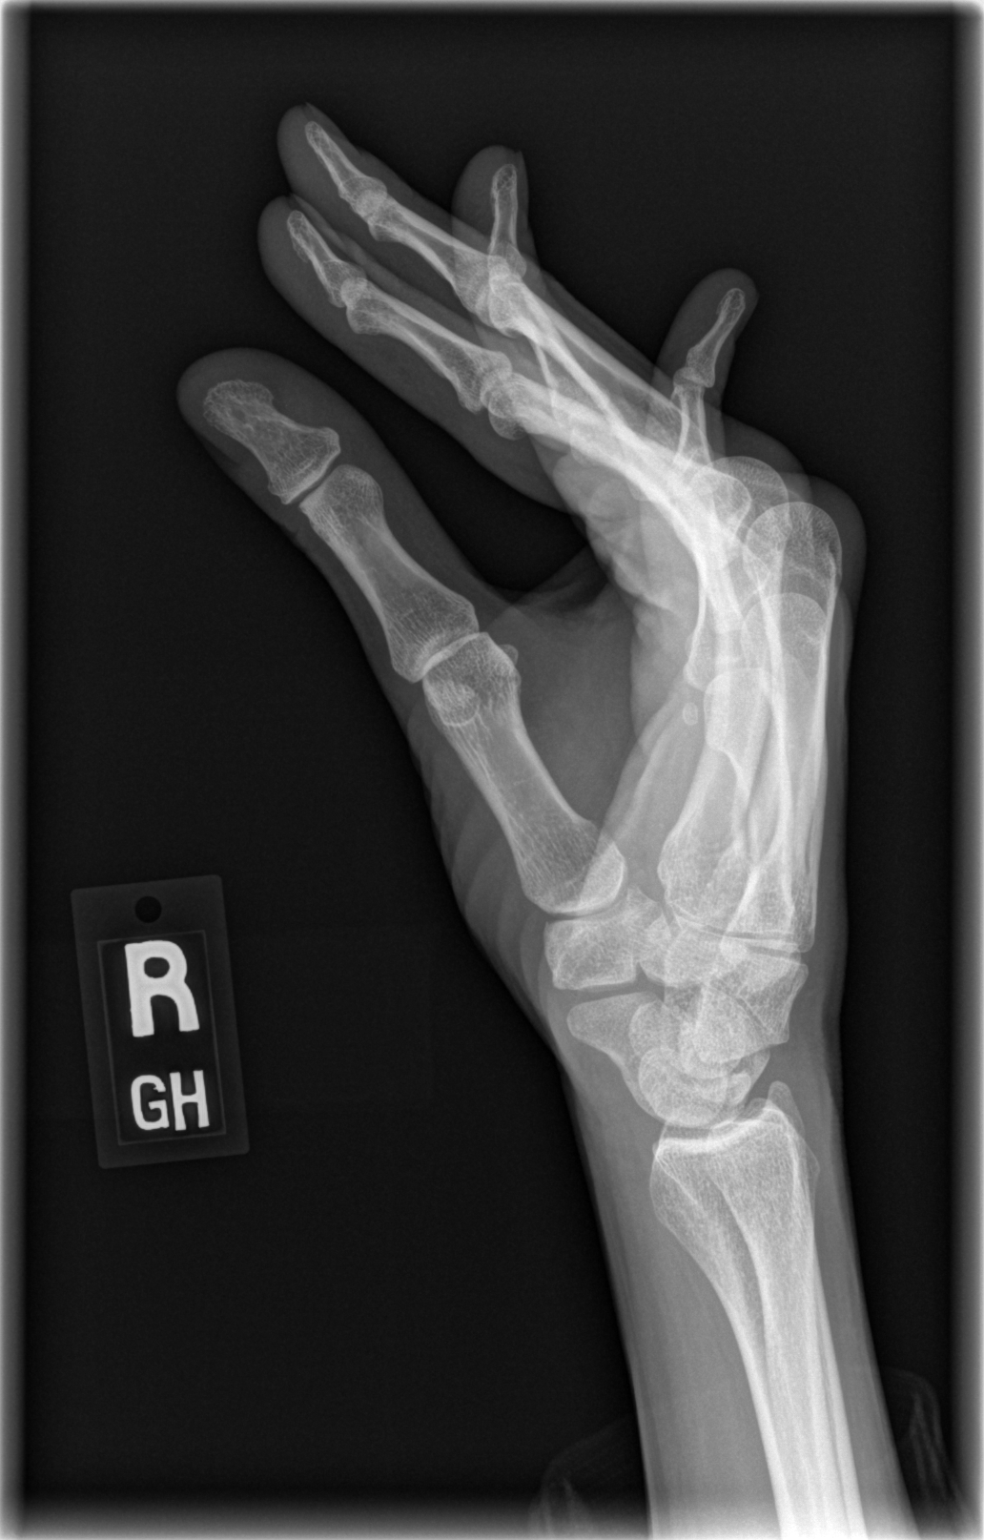

[3 of 3 positions shown; findings below may reference images not displayed]

FINDINGS: No evidence of fracture, dislocation, or joint effusion. No evidence
of severe arthropathy. No aggressive appearing focal bone
abnormality. Soft tissues are unremarkable.
IMPRESSION: Negative.

## 2023-07-02 ENCOUNTER — Telehealth: Payer: Self-pay | Admitting: Internal Medicine

## 2023-07-02 NOTE — Telephone Encounter (Signed)
 Please schedule Transfer of care

## 2023-07-02 NOTE — Telephone Encounter (Signed)
 Copied from CRM [id number]. Topic: Appointments - Transfer of Care >> Jul 02, 2023 11:42 AM Desma Mcgregor wrote: Pt is requesting to transfer FROM: [place] at [facility] Pt is requesting to transfer TO: [place] at [facility] Reason for requested transfer: Location/Commute It is the responsibility of the team the patient would like to transfer to (Dr. Salvatore Decent) to reach out to the patient if for any reason this transfer is not acceptable.

## 2023-08-14 ENCOUNTER — Telehealth: Payer: Self-pay

## 2023-08-14 ENCOUNTER — Ambulatory Visit (INDEPENDENT_AMBULATORY_CARE_PROVIDER_SITE_OTHER): Payer: Self-pay | Admitting: Internal Medicine

## 2023-08-14 VITALS — BP 110/80 | HR 88 | Temp 98.3°F | Ht 62.0 in | Wt 96.0 lb

## 2023-08-14 DIAGNOSIS — R1310 Dysphagia, unspecified: Secondary | ICD-10-CM | POA: Diagnosis not present

## 2023-08-14 DIAGNOSIS — F439 Reaction to severe stress, unspecified: Secondary | ICD-10-CM | POA: Diagnosis not present

## 2023-08-14 DIAGNOSIS — R55 Syncope and collapse: Secondary | ICD-10-CM | POA: Diagnosis not present

## 2023-08-14 DIAGNOSIS — R5382 Chronic fatigue, unspecified: Secondary | ICD-10-CM | POA: Diagnosis not present

## 2023-08-14 DIAGNOSIS — M069 Rheumatoid arthritis, unspecified: Secondary | ICD-10-CM

## 2023-08-14 LAB — COMPREHENSIVE METABOLIC PANEL WITH GFR
ALT: 53 U/L — ABNORMAL HIGH (ref 0–35)
AST: 139 U/L — ABNORMAL HIGH (ref 0–37)
Albumin: 4.2 g/dL (ref 3.5–5.2)
Alkaline Phosphatase: 125 U/L — ABNORMAL HIGH (ref 39–117)
BUN: 4 mg/dL — ABNORMAL LOW (ref 6–23)
CO2: 27 meq/L (ref 19–32)
Calcium: 9 mg/dL (ref 8.4–10.5)
Chloride: 102 meq/L (ref 96–112)
Creatinine, Ser: 0.5 mg/dL (ref 0.40–1.20)
GFR: 116.54 mL/min (ref >60.00)
Glucose, Bld: 83 mg/dL (ref 70–99)
Potassium: 4.5 meq/L (ref 3.5–5.1)
Sodium: 141 meq/L (ref 135–145)
Total Bilirubin: 1.2 mg/dL (ref 0.2–1.2)
Total Protein: 6.9 g/dL (ref 6.0–8.3)

## 2023-08-14 LAB — CBC WITH DIFFERENTIAL/PLATELET
Basophils Absolute: 0.1 10*3/uL (ref 0.0–0.1)
Basophils Relative: 3.5 % — ABNORMAL HIGH (ref 0.0–3.0)
Eosinophils Absolute: 0.3 10*3/uL (ref 0.0–0.7)
Eosinophils Relative: 7.6 % — ABNORMAL HIGH (ref 0.0–5.0)
HCT: 41.3 % (ref 36.0–46.0)
Hemoglobin: 13.8 g/dL (ref 12.0–15.0)
Lymphocytes Relative: 41.6 % (ref 12.0–46.0)
Lymphs Abs: 1.8 10*3/uL (ref 0.7–4.0)
MCHC: 33.4 g/dL (ref 30.0–36.0)
MCV: 111.4 fl — ABNORMAL HIGH (ref 78.0–100.0)
Monocytes Absolute: 0.5 10*3/uL (ref 0.1–1.0)
Monocytes Relative: 11.1 % (ref 3.0–12.0)
Neutro Abs: 1.5 10*3/uL (ref 1.4–7.7)
Neutrophils Relative %: 36.2 % — ABNORMAL LOW (ref 43.0–77.0)
Platelets: 192 10*3/uL (ref 150.0–400.0)
RBC: 3.71 Mil/uL — ABNORMAL LOW (ref 3.87–5.11)
RDW: 13.3 % (ref 11.5–15.5)
WBC: 4.2 10*3/uL (ref 4.0–10.5)

## 2023-08-14 LAB — HEMOGLOBIN A1C: Hgb A1c MFr Bld: 4.8 % (ref 4.6–6.5)

## 2023-08-14 LAB — VITAMIN D 25 HYDROXY (VIT D DEFICIENCY, FRACTURES): VITD: 17.59 ng/mL — ABNORMAL LOW (ref 30.00–100.00)

## 2023-08-14 LAB — B12 AND FOLATE PANEL
Folate: 3.7 ng/mL — ABNORMAL LOW (ref >5.9)
Vitamin B-12: 324 pg/mL (ref 211–911)

## 2023-08-14 NOTE — Telephone Encounter (Signed)
 Left voicemail asking patient to provide prior Rheumatologist info so we can request medical records

## 2023-08-14 NOTE — Patient Instructions (Addendum)
 Natural supplements for Anxiety:  Ashwaganda  Magnesium Glycinate for anxiety - take at bedtime

## 2023-08-14 NOTE — Progress Notes (Signed)
 [facility] [address] [place] [address] Dept: [phone] Dept Fax: [phone]  New Patient Office Visit  Subjective:   Debra Gibbs [birth year] 08/14/2023  Chief Complaint  Patient presents with   Transitions Of Care   Fatigue   Weight Loss    Lab work,  food getting stuck in throat      HPI: Debra Gibbs presents today to TRANSFER care at [facility] [employer]. Introduced to Publishing rights manager role and practice setting.  All questions answered.  Concerns: See below   Discussed the use of AI scribe software for clinical note transcription with the patient, who gave verbal consent to proceed.  History of Present Illness   [name] is a 43 year old female who presents with chronic fatigue and episodes of syncope.  She has experienced chronic fatigue for the past one to two years, describing a persistent lack of energy and feeling 'tired all the time.' She has eliminated caffeine , except for occasional tea, and attributes significant stress in her life, including a divorce and teenage children, as contributing factors. Despite these stressors, she does not feel depressed and continues to fulfill her daily responsibilities. She has difficulty staying asleep due to a racing mind.  She recounts two episodes of syncope. The first occurred last year at a [facility], where she passed out and hit her head, with low blood pressure noted at the time. The second episode happened in February of this year at her daughter's oral surgeon's office, where she passed out twice within twenty minutes. She had not eaten that morning and was given food and juice after the first episode. There is a family history of syncope, with her sister having a related diagnosis of vasovagal syncope.   She has a history of rheumatoid arthritis and has been on various medications, possibly Celebrex (patient is unsure), but is  currently not taking any medications for it. She experiences diffuse joint pain currently. She was seen by rheumatology in the past, but then her provider left the practice.   She reports difficulty with swallowing, particularly with meats and breads, which sometimes get stuck in her throat, causing her to run and try to dislodge the food. This has been ongoing for several years. She experiences significant acid reflux but does not take medication for it. She also has chronic morning mucus drainage, which she attributes to allergies. She has tried acid reducing medications and allergy medications for symptoms, which help with allergies and GERD, but have not alleviated food getting stuck when swallowing.      08/14/2023    9:56 AM  Depression screen PHQ 2/9  Decreased Interest 0  Down, Depressed, Hopeless 0  PHQ - 2 Score 0    The following portions of the patient's history were reviewed and updated as appropriate: past medical history, past surgical history, family history, social history, allergies, medications, and problem list.   Patient Active Problem List   Diagnosis Date Noted   MDD (major depressive disorder), recurrent episode, mild (HCC) 03/01/2021   Reflux esophagitis 03/09/2020   Allergic asthma 08/21/2019   Sciatica of left side 12/09/2015   Chronic lower back pain 12/09/2015   Dactylitis 03/04/2012   Hemorrhoids 12/22/2011   Rheumatoid arthritis (HCC) 11/04/2009   Multiple joint pain 07/21/2008   NEURODERMATITIS 04/29/2007   Situational stress 12/24/2006   Past Medical History:  Diagnosis Date   Anxiety    Headache    with pregnancy only  History of syncope    Rheumatoid arthritis(714.0)    Vision abnormalities    Past Surgical History:  Procedure Laterality Date   WISDOM TOOTH EXTRACTION     Family History  Problem Relation Age of Onset   Lupus Mother    Arthritis Father     Current Outpatient Medications:    levonorgestrel  (MIRENA ) 20 MCG/24HR IUD, 1  each by Intrauterine route once., Disp: , Rfl:  Allergies  Allergen Reactions   Citalopram Hydrobromide Other (See Comments)    REACTION: shakes and made her jaw tight   Macrobid [Nitrofurantoin] Nausea And Vomiting   Tramadol      Other reaction(s): jaw locks, shakes   Albuterol  Anxiety   Fluoxetine  Hcl Nausea Only    ROS: A complete ROS was performed with pertinent positives/negatives noted in the HPI. The remainder of the ROS are negative.   Objective:   Today's Vitals   08/14/23 0957  BP: 110/80  Pulse: 88  Temp: 98.3 F (36.8 C)  TempSrc: Temporal  SpO2: 95%  Weight: 96 lb (43.5 kg)  Height: 5\' 2"  (1.575 m)    GENERAL: Well-appearing, in NAD. Well nourished.  SKIN: Pink, warm and dry. No rash, lesion, ulceration, or ecchymoses.  NECK: Trachea midline. Full ROM w/o pain or tenderness. No lymphadenopathy.  RESPIRATORY: Chest wall symmetrical. Respirations even and non-labored. Breath sounds clear to auscultation bilaterally.  CARDIAC: S1, S2 present, regular rate and rhythm. Peripheral pulses 2+ bilaterally.  EXTREMITIES: Without clubbing, cyanosis, or edema.  NEUROLOGIC: No motor or sensory deficits. Steady, even gait.  PSYCH/MENTAL STATUS: Alert, oriented x 3. Cooperative, appropriate mood and affect.   Health Maintenance Due  Topic Date Due   Hepatitis C Screening  Never done   Pneumococcal Vaccine 75-59 Years old (1 of 2 - PCV) Never done   Cervical Cancer Screening (HPV/Pap Cotest)  04/15/2017    No results found for any visits on 08/14/23.  Assessment & Plan:  Assessment and Plan    Chronic fatigue Chronic fatigue with unclear etiology. Possible factors: stress, thyroid dysfunction, diabetes, anemia. Family history of thyroid issues and diabetes. - Order blood tests: thyroid panel, A1c, B12, vitamin D , CBC, electrolytes.  Syncope Recurrent syncope with family history. Possible factors: hypoglycemia, dehydration, orthostatic hypotension, vasovagal -  Advise increased water intake and adequate protein consumption.  Dysphagia Chronic dysphagia with meats and breads.  Previous medications ineffective.  - Refer to gastroenterology for evaluation and possible endoscopy.  Rheumatoid arthritis Rheumatoid arthritis with joint pain. Previous rheumatologist retired, no new provider established. - Refer to rheumatology for evaluation and management.  Situational Stress  No SI/HI. Discussion of supplements for anxiety management. Patient prefers natural holistic approach when possible.  - Recommend ashwagandha and magnesium glycinate for anxiety management.  Follow-up Plan for follow-up to review lab results and management. - Schedule follow-up in three months for annual physical. - Advise contact if referrals not received in two weeks.      Orders Placed This Encounter  Procedures   CBC with Differential/Platelet   Comprehensive metabolic panel with GFR   B12 and Folate Panel   VITAMIN D  25 Hydroxy (Vit-D Deficiency, Fractures)   Hemoglobin A1C   Thyroid Panel With TSH   Ambulatory referral to Gastroenterology    Referral Priority:   Routine    Referral Type:   Consultation    Referral Reason:   Specialty Services Required    Number of Visits Requested:   1   Ambulatory referral to Rheumatology  Referral Priority:   Routine    Referral Type:   Consultation    Referral Reason:   Specialty Services Required    Requested Specialty:   Rheumatology    Number of Visits Requested:   1   No orders of the defined types were placed in this encounter.   Return in about 3 months (around 11/14/2023) for Annual Physical Exam with fasting lab work.   Gavin Kast, FNP

## 2023-08-15 LAB — THYROID PANEL WITH TSH
Free Thyroxine Index: 2.2 (ref 1.4–3.8)
T3 Uptake: 31 % (ref 22–35)
T4, Total: 7.1 ug/dL (ref 5.1–11.9)
TSH: 0.74 m[IU]/L

## 2023-08-16 ENCOUNTER — Other Ambulatory Visit: Payer: Self-pay | Admitting: Internal Medicine

## 2023-08-16 DIAGNOSIS — E559 Vitamin D deficiency, unspecified: Secondary | ICD-10-CM

## 2023-08-16 DIAGNOSIS — E538 Deficiency of other specified B group vitamins: Secondary | ICD-10-CM

## 2023-08-16 DIAGNOSIS — R7989 Other specified abnormal findings of blood chemistry: Secondary | ICD-10-CM

## 2023-08-16 MED ORDER — FOLIC ACID 1 MG PO TABS: 1.0000 mg | ORAL_TABLET | Freq: Every day | ORAL | 1 refills | Status: AC

## 2023-08-16 MED ORDER — VITAMIN D (ERGOCALCIFEROL) 1.25 MG (50000 UNIT) PO CAPS: 50000.0000 [IU] | ORAL_CAPSULE | ORAL | 1 refills | Status: AC

## 2023-08-21 ENCOUNTER — Other Ambulatory Visit (INDEPENDENT_AMBULATORY_CARE_PROVIDER_SITE_OTHER)

## 2023-08-21 DIAGNOSIS — R7989 Other specified abnormal findings of blood chemistry: Secondary | ICD-10-CM | POA: Diagnosis not present

## 2023-08-21 LAB — GAMMA GT: GGT: 919 U/L — ABNORMAL HIGH (ref 7–51)

## 2023-08-23 ENCOUNTER — Other Ambulatory Visit: Payer: Self-pay | Admitting: Internal Medicine

## 2023-08-23 ENCOUNTER — Ambulatory Visit: Payer: Self-pay | Admitting: Internal Medicine

## 2023-08-23 DIAGNOSIS — R7989 Other specified abnormal findings of blood chemistry: Secondary | ICD-10-CM

## 2023-08-23 LAB — HEPB+HEPC+HIV PANEL
HIV Screen 4th Generation wRfx: NONREACTIVE
Hep B C IgM: NEGATIVE
Hep B Core Total Ab: NEGATIVE
Hep B E Ab: NONREACTIVE
Hep B E Ag: NEGATIVE
Hep B Surface Ab, Qual: NONREACTIVE
Hep C Virus Ab: NONREACTIVE
Hepatitis B Surface Ag: NEGATIVE

## 2023-11-16 ENCOUNTER — Encounter: Admitting: Internal Medicine
